# Patient Record
Sex: Female | Born: 1975 | Race: Black or African American | Hispanic: No | Marital: Single | Smoking: Former smoker
Health system: Southern US, Community
[De-identification: ages and names within clinical notes are randomized; demographics above are authoritative.]

## PROBLEM LIST (undated history)

## (undated) DIAGNOSIS — R5383 Other fatigue: Secondary | ICD-10-CM

## (undated) DIAGNOSIS — F29 Unspecified psychosis not due to a substance or known physiological condition: Secondary | ICD-10-CM

## (undated) DIAGNOSIS — J189 Pneumonia, unspecified organism: Secondary | ICD-10-CM

## (undated) DIAGNOSIS — T4145XA Adverse effect of unspecified anesthetic, initial encounter: Secondary | ICD-10-CM

## (undated) DIAGNOSIS — I10 Essential (primary) hypertension: Secondary | ICD-10-CM

## (undated) DIAGNOSIS — F4310 Post-traumatic stress disorder, unspecified: Secondary | ICD-10-CM

## (undated) DIAGNOSIS — T8859XA Other complications of anesthesia, initial encounter: Secondary | ICD-10-CM

## (undated) DIAGNOSIS — F329 Major depressive disorder, single episode, unspecified: Secondary | ICD-10-CM

## (undated) DIAGNOSIS — R51 Headache: Secondary | ICD-10-CM

## (undated) DIAGNOSIS — F32A Depression, unspecified: Secondary | ICD-10-CM

## (undated) DIAGNOSIS — E119 Type 2 diabetes mellitus without complications: Secondary | ICD-10-CM

## (undated) DIAGNOSIS — R519 Headache, unspecified: Secondary | ICD-10-CM

## (undated) DIAGNOSIS — K219 Gastro-esophageal reflux disease without esophagitis: Secondary | ICD-10-CM

## (undated) DIAGNOSIS — Z8489 Family history of other specified conditions: Secondary | ICD-10-CM

## (undated) HISTORY — DX: Major depressive disorder, single episode, unspecified: F32.9

## (undated) HISTORY — PX: NOVASURE ABLATION: SHX5394

## (undated) HISTORY — DX: Unspecified psychosis not due to a substance or known physiological condition: F29

## (undated) HISTORY — PX: BREAST REDUCTION SURGERY: SHX8

## (undated) HISTORY — DX: Headache: R51

## (undated) HISTORY — DX: Type 2 diabetes mellitus without complications: E11.9

## (undated) HISTORY — DX: Headache, unspecified: R51.9

## (undated) HISTORY — DX: Post-traumatic stress disorder, unspecified: F43.10

## (undated) HISTORY — DX: Depression, unspecified: F32.A

## (undated) HISTORY — DX: Essential (primary) hypertension: I10

## (undated) HISTORY — DX: Other fatigue: R53.83

---

## 2001-11-16 ENCOUNTER — Ambulatory Visit (HOSPITAL_BASED_OUTPATIENT_CLINIC_OR_DEPARTMENT_OTHER): Admission: RE | Admit: 2001-11-16 | Discharge: 2001-11-17 | Payer: Self-pay | Admitting: Plastic Surgery

## 2003-11-09 HISTORY — PX: REDUCTION MAMMAPLASTY: SUR839

## 2005-01-28 ENCOUNTER — Emergency Department: Payer: Self-pay | Admitting: Emergency Medicine

## 2005-08-16 ENCOUNTER — Other Ambulatory Visit: Payer: Self-pay

## 2005-08-20 ENCOUNTER — Ambulatory Visit: Payer: Self-pay | Admitting: Obstetrics and Gynecology

## 2006-08-15 ENCOUNTER — Emergency Department: Payer: Self-pay | Admitting: Emergency Medicine

## 2006-12-15 ENCOUNTER — Emergency Department: Payer: Self-pay | Admitting: Emergency Medicine

## 2007-07-19 ENCOUNTER — Emergency Department: Payer: Self-pay | Admitting: Emergency Medicine

## 2010-10-27 ENCOUNTER — Emergency Department: Payer: Self-pay | Admitting: Emergency Medicine

## 2011-01-20 ENCOUNTER — Emergency Department: Payer: Self-pay | Admitting: Emergency Medicine

## 2011-03-17 ENCOUNTER — Ambulatory Visit: Payer: Self-pay | Admitting: Specialist

## 2012-01-09 ENCOUNTER — Emergency Department: Payer: Self-pay | Admitting: Unknown Physician Specialty

## 2012-01-09 LAB — CBC
HCT: 39.6 % (ref 35.0–47.0)
HGB: 13.2 g/dL (ref 12.0–16.0)
MCH: 28.1 pg (ref 26.0–34.0)
Platelet: 275 10*3/uL (ref 150–440)
RBC: 4.69 10*6/uL (ref 3.80–5.20)
RDW: 14.7 % — ABNORMAL HIGH (ref 11.5–14.5)
WBC: 5.1 10*3/uL (ref 3.6–11.0)

## 2012-01-09 LAB — CK TOTAL AND CKMB (NOT AT ARMC)
CK, Total: 160 U/L (ref 21–215)
CK-MB: 0.6 ng/mL (ref 0.5–3.6)

## 2012-01-09 LAB — COMPREHENSIVE METABOLIC PANEL
Albumin: 3.4 g/dL (ref 3.4–5.0)
Alkaline Phosphatase: 73 U/L (ref 50–136)
BUN: 12 mg/dL (ref 7–18)
Bilirubin,Total: 0.4 mg/dL (ref 0.2–1.0)
Chloride: 101 mmol/L (ref 98–107)
Creatinine: 0.94 mg/dL (ref 0.60–1.30)
EGFR (African American): >60
Glucose: 270 mg/dL — ABNORMAL HIGH (ref 65–99)
Osmolality: 287 (ref 275–301)
SGPT (ALT): 38 U/L
Total Protein: 7.5 g/dL (ref 6.4–8.2)

## 2012-01-09 LAB — TROPONIN I
Troponin-I: <0.02 ng/mL
Troponin-I: <0.02 ng/mL

## 2013-08-08 ENCOUNTER — Emergency Department: Payer: Self-pay | Admitting: Emergency Medicine

## 2013-08-08 LAB — COMPREHENSIVE METABOLIC PANEL
Albumin: 3.6 g/dL (ref 3.4–5.0)
Alkaline Phosphatase: 87 U/L (ref 50–136)
BUN: 11 mg/dL (ref 7–18)
Calcium, Total: 8.9 mg/dL (ref 8.5–10.1)
Creatinine: 0.96 mg/dL (ref 0.60–1.30)
EGFR (African American): >60
EGFR (Non-African Amer.): >60
Glucose: 225 mg/dL — ABNORMAL HIGH (ref 65–99)
Osmolality: 282 (ref 275–301)
Potassium: 3.7 mmol/L (ref 3.5–5.1)
SGOT(AST): 27 U/L (ref 15–37)
SGPT (ALT): 47 U/L (ref 12–78)
Sodium: 138 mmol/L (ref 136–145)
Total Protein: 7.8 g/dL (ref 6.4–8.2)

## 2013-08-08 LAB — URINALYSIS, COMPLETE
Bilirubin,UR: NEGATIVE
Blood: NEGATIVE
Glucose,UR: >=500 mg/dL (ref 0–75)
Leukocyte Esterase: NEGATIVE
Nitrite: NEGATIVE
Protein: NEGATIVE
RBC,UR: 1 /HPF (ref 0–5)

## 2013-08-08 LAB — CBC
MCH: 28.5 pg (ref 26.0–34.0)
MCHC: 34.2 g/dL (ref 32.0–36.0)
MCV: 83 fL (ref 80–100)
Platelet: 321 10*3/uL (ref 150–440)
RBC: 5.08 10*6/uL (ref 3.80–5.20)
RDW: 13.9 % (ref 11.5–14.5)
WBC: 6.4 10*3/uL (ref 3.6–11.0)

## 2014-04-21 ENCOUNTER — Ambulatory Visit: Payer: Self-pay | Admitting: Obstetrics and Gynecology

## 2014-04-21 LAB — URINALYSIS, COMPLETE
BACTERIA: NONE SEEN
BILIRUBIN, UR: NEGATIVE
BLOOD: NEGATIVE
Glucose,UR: NEGATIVE mg/dL (ref 0–75)
Leukocyte Esterase: NEGATIVE
NITRITE: NEGATIVE
PH: 5 (ref 4.5–8.0)
Protein: 30
Specific Gravity: 1.028 (ref 1.003–1.030)

## 2014-04-21 LAB — CBC WITH DIFFERENTIAL/PLATELET
BASOS PCT: 0.2 %
Basophil #: 0 10*3/uL (ref 0.0–0.1)
Eosinophil #: 0 10*3/uL (ref 0.0–0.7)
Eosinophil %: 0.2 %
HCT: 41.5 % (ref 35.0–47.0)
HGB: 13.7 g/dL (ref 12.0–16.0)
Lymphocyte #: 1.1 10*3/uL (ref 1.0–3.6)
Lymphocyte %: 7.4 %
MCH: 27.5 pg (ref 26.0–34.0)
MCHC: 33.1 g/dL (ref 32.0–36.0)
MCV: 83 fL (ref 80–100)
MONO ABS: 0.8 x10 3/mm (ref 0.2–0.9)
MONOS PCT: 5.7 %
NEUTROS PCT: 86.5 %
Neutrophil #: 12.8 10*3/uL — ABNORMAL HIGH (ref 1.4–6.5)
Platelet: 383 10*3/uL (ref 150–440)
RBC: 4.99 10*6/uL (ref 3.80–5.20)
RDW: 14.8 % — ABNORMAL HIGH (ref 11.5–14.5)
WBC: 14.8 10*3/uL — AB (ref 3.6–11.0)

## 2014-04-21 LAB — COMPREHENSIVE METABOLIC PANEL
ANION GAP: 7 (ref 7–16)
Albumin: 3.4 g/dL (ref 3.4–5.0)
Alkaline Phosphatase: 83 U/L
BILIRUBIN TOTAL: 0.7 mg/dL (ref 0.2–1.0)
BUN: 8 mg/dL (ref 7–18)
CALCIUM: 8.9 mg/dL (ref 8.5–10.1)
CHLORIDE: 105 mmol/L (ref 98–107)
Co2: 24 mmol/L (ref 21–32)
Creatinine: 0.86 mg/dL (ref 0.60–1.30)
EGFR (African American): >60
GLUCOSE: 162 mg/dL — AB (ref 65–99)
OSMOLALITY: 274 (ref 275–301)
POTASSIUM: 3.8 mmol/L (ref 3.5–5.1)
SGOT(AST): 20 U/L (ref 15–37)
SGPT (ALT): 22 U/L (ref 12–78)
SODIUM: 136 mmol/L (ref 136–145)
Total Protein: 8.2 g/dL (ref 6.4–8.2)

## 2014-04-21 LAB — GC/CHLAMYDIA PROBE AMP

## 2014-04-21 LAB — WET PREP, GENITAL

## 2014-04-23 LAB — PATHOLOGY REPORT

## 2014-04-25 LAB — WOUND CULTURE

## 2015-01-23 ENCOUNTER — Observation Stay: Payer: Self-pay | Admitting: Internal Medicine

## 2015-02-09 ENCOUNTER — Emergency Department: Admit: 2015-02-09 | Disposition: A | Discharge status: Home / Self Care | Payer: Self-pay | Admitting: Family Medicine

## 2015-02-09 LAB — COMPREHENSIVE METABOLIC PANEL
ANION GAP: 6 — AB (ref 7–16)
Albumin: 3.4 g/dL — ABNORMAL LOW
Alkaline Phosphatase: 70 U/L
BUN: 12 mg/dL
Bilirubin,Total: 0.9 mg/dL
CALCIUM: 8.7 mg/dL — AB
CO2: 26 mmol/L
CREATININE: 0.74 mg/dL
Chloride: 106 mmol/L
EGFR (Non-African Amer.): >60
GLUCOSE: 212 mg/dL — AB
Potassium: 4.4 mmol/L
SGOT(AST): 20 U/L
SGPT (ALT): 19 U/L
Sodium: 138 mmol/L
Total Protein: 7.2 g/dL

## 2015-02-09 LAB — CBC
HCT: 39.9 % (ref 35.0–47.0)
HGB: 12.8 g/dL (ref 12.0–16.0)
MCH: 26.9 pg (ref 26.0–34.0)
MCHC: 32 g/dL (ref 32.0–36.0)
MCV: 84 fL (ref 80–100)
Platelet: 315 10*3/uL (ref 150–440)
RBC: 4.76 10*6/uL (ref 3.80–5.20)
RDW: 13.7 % (ref 11.5–14.5)
WBC: 5.5 10*3/uL (ref 3.6–11.0)

## 2015-02-09 LAB — TROPONIN I: Troponin-I: <0.03 ng/mL

## 2015-02-22 LAB — CULTURE, BLOOD (SINGLE)

## 2015-03-01 NOTE — Op Note (Signed)
PATIENT NAME:  Rebekah Jacobs, Rebekah Jacobs MR#:  [id number] DATE OF BIRTH:  02-08-1976  DATE OF PROCEDURE:  04/21/2014  PREOPERATIVE DIAGNOSES:  1. Pelvic pain.  2. Abnormal collection of fluid in the uterine cavity.   POSTOPERATIVE DIAGNOSES: 1. Pelvic pain.  2. Hematometra/pyometrium.   PROCEDURES:  1. Hysteroscopy.  2. Suction, dilation, and curettage.   ANESTHESIA: General.   SURGEON: Thomasene MohairStephen Jackson, MD   ESTIMATED BLOOD LOSS: 10 mL.  OPERATIVE FLUIDS: 500 mL crystalloid.   COMPLICATIONS: None.   FINDINGS: Approximately 100 mL of blood and pus-like material in the uterus which was mostly blood.   SPECIMENS:  1. Endometrial curettings.  2. Sample of uterine fluid for culture.   CONDITION AT THE END OF PROCEDURE: Stable.   PROCEDURE IN DETAIL: The patient was taken to the operating room where general anesthesia was administered and found to be adequate. She was placed in the dorsal supine high lithotomy position in candy cane stirrups and prepped and draped in the usual sterile fashion. After a timeout was called, an in and out catheterization was performed for return of about 25 mL of clear urine. A sterile speculum was placed in the vagina and a single-tooth tenaculum was used to grasp the anterior lip of the cervix. The cervix was dilated gently in a serial fashion using Hegar dilators to a dilatation of approximately 8 mm. Noted upon dilatation the efflux of the dark, red blood-like material with just a small amount of white streaks. Once dilatation to 8 mm was obtained, a curette was passed and more material returned. Given concern for clot in the uterus, an 8-mm rigid suction curette was utilized to clean out and remove all clot-like material and break up any septations that might be present. After this was accomplished, another gentle curettage was performed. The hysteroscope was then gently introduced through the cervix into the uterus with the above-noted findings. The uterine cavity  was somewhat still deformed given her history of endometrial ablation. No other abnormal findings were appreciated at this point. Given that all material inside the uterus was removed and verification that no loculated fliud collections remained and hemostasis was noted, the hysteroscope was removed and the procedure was terminated. The single-tooth tenaculum was removed from the anterior lip of the cervix and hemostasis was noted at the tenaculum entry sites. The vagina was inspected and no instrumentation or sponges were left in the vagina. The speculum was removed.   The patient tolerated the procedure well. Sponge, lap, and needle counts were correct x 2. For antibiotic prophylaxis, the patient was given 100 mg of doxycycline just prior to the start of the procedure. The patient was awakened in the operating room and taken to the recovery area in stable condition.    ____________________________ Rebekah NovakStephen D. Jackson, MD sdj:lt D: 04/21/2014 21:34:02 ET T: 04/21/2014 21:50:47 ET JOB#: [id number]  cc: Rebekah NovakStephen D. Jackson, MD, <Dictator> Rebekah NovakSTEPHEN D JACKSON MD ELECTRONICALLY SIGNED 05/09/2014 9:49

## 2015-03-09 NOTE — H&P (Signed)
PATIENT NAME:  Rebekah Jacobs, Rebekah Jacobs MR#:  [id number] DATE OF BIRTH:  09-23-1976  DATE OF ADMISSION:  01/23/2015  PRIMARY CARE PROVIDER: Dr. Lin Givens  EMERGENCY DEPARTMENT REFERRING PHYSICIAN: Dr. Mayford Knife  CHIEF COMPLAINT: Shortness of breath, cough, fever of 102, elevated troponin.  HISTORY OF PRESENT ILLNESS: The patient is a 39 year old female with history of hypertension, diabetes, hyperlipidemia, and morbid obesity who states that she had a nephew visiting her at her house a few days ago and he was sick. Subsequently, she started having cough and shortness of breath. She has had productive mucus of yellow color with blood tinge and her shortness of breath is mainly with activity. The patient has also had fevers. Has not had any wheezing. The patient has no previous lung disease. No history of smoking. She came to the ED with these symptoms and was noted to have a troponin that was mildly abnormal at 0.04. She reports only chest pain with taking deep breaths but no chest pressure. Her EKG is negative. ED physician is concerned about this abnormal troponin and he wanted her to be observed to follow the trend.  PAST MEDICAL HISTORY: 1.  Diabetes type 2.  2.  Bulging disk.  3.  Hypertension.  4.  Breast reduction. 5.  Hysterectomy.   ALLERGIES: PEANUTS, SHELLFISH.   CURRENT MEDICATIONS: Metformin 1000 one tab p.o. b.i.d., Lipitor 10 mg once a day, hydrochlorothiazide 25 p.o. daily.   SOCIAL HISTORY: Does not smoke. Does not drink. No drugs.   FAMILY HISTORY: Sister died of heart disease at age 44 months. No other significant family history of heart disease.   REVIEW OF SYSTEMS: CONSTITUTIONAL: Complains of fevers. No fatigue. No weakness. No weight loss. No weight gain.  EYES: No blurred or double vision. No redness. No inflammation.  ENT: No tinnitus. No ear pain. No hearing loss. No seasonal or year-round allergies. No epistaxis.  RESPIRATORY: Complains of cough. No wheezing. No COPD. No TB.   CARDIOVASCULAR: Complains of chest pain with coughing, but no chest pain on exertion. No orthopnea. No edema. No arrhythmia. No palpitation.  GASTROINTESTINAL: No nausea, vomiting, diarrhea. No abdominal pain. No hematemesis.  GENITOURINARY: Denies any frequency, urgency or hesitancy.  ENDOCRINE: Denies any polyuria, nocturia, OR thyroid enlargement. HEME AND LYMPH:  Denies anemia, easy bruisability or bleeding.  SKIN: No acne. No rash.  MUSCULOSKELETAL: No pain in the neck, back or shoulder.  NEUROLOGIC: No CVA, TIA or seizures.  PSYCHIATRIC: No anxiety, insomnia.   PHYSICAL EXAMINATION: VITAL SIGNS: Temperature 98.1, pulse 93, respirations 20, blood pressure 131/92, O2 99%.  GENERAL: The patient is a morbidly obese female in no acute distress.  HEENT: Head atraumatic, normocephalic. Pupils equally round and reactive to light and accommodation. There is no conjunctival pallor. No sclerae icterus. Nasal exam shows no drainage or ulceration. Oropharynx is clear without any exudate.  NECK: Supple without any thyromegaly.  CARDIOVASCULAR: Regular rate and rhythm. No murmurs, rubs, clicks, or gallops.  LUNGS: Clear to auscultation bilaterally without any rales, rhonchi, wheezing.  ABDOMEN: Soft, nontender, nondistended. Positive bowel sounds x4.  EXTREMITIES: No clubbing, cyanosis, or edema.  SKIN: No rash.  LYMPH NODES: Nonpalpable.  MUSCULOSKELETAL: There is no erythema or swelling.  VASCULAR: Good DP and PT pulses.  PSYCHIATRIC: Not anxious or depressed. Marland Kitchen  DIAGNOSTIC DATA: CT of chest: Patchy pulmonary opacities at left base, which are indeterminate, most likely reflect inflammatory process.   WBC 7.8, hemoglobin 12.6, platelet count 305,000. Glucose 164, BUN 8, creatinine 0.64, sodium  139, potassium 3.7, chloride 105, calcium 8.6. Troponin 0.04.   EKG: Normal sinus rhythm without any ST-T wave changes.   ASSESSMENT AND PLAN: The patient is a 39 year old with history of hypertension  and diabetes who presents with cough, shortness of breath, chest pain with coughing, noted to have troponin of 0.04.  1.  Shortness of breath, cough, and fever. Likely due to possible atypical pneumonia or bronchitis. At this time, we will treat her with IV Levaquin, place her antitussives.  2.  Elevated troponin, possibly minimally elevated due to acute respiratory symptoms. Monitor. If starts trending up, then will need further cardiac evaluation. 3.  Diabetes. We will hold metformin due to IV contrast. We will place her on sliding scale insulin.  4.  Hypertension. Continue HCTZ.  5.  Hyperlipidemia. Continue Lipitor as taking at home.  TIME SPENT: 50 minutes.  ____________________________ Lacie ScottsShreyang H. Allena KatzPatel, MD shp:sb D: 01/23/2015 14:29:26 ET T: 01/23/2015 14:39:59 ET JOB#: [id number]  cc: Prabhjot Piscitello H. Allena KatzPatel, MD, <Dictator> Charise CarwinSHREYANG H Joseth Weigel MD ELECTRONICALLY SIGNED 02/03/2015 14:16

## 2015-03-09 NOTE — Discharge Summary (Signed)
PATIENT NAME:  Rebekah Jacobs, Rebekah Jacobs MR#:  [id number] DATE OF BIRTH:  1976-02-24  DATE OF ADMISSION:  01/23/2015 DATE OF DISCHARGE:  01/24/2015  PRIMARY CARE PHYSICIAN: Dr. Lin GivensSavage  DISCHARGE DIAGNOSES:  1.  Left-sided pneumonia.  2.  Hypertension.  3.  Diabetes.  4.  Hyperlipidemia.  5.  Obesity.   CONDITION: Stable.   CODE STATUS: FULL.  HOME MEDICATIONS: Please refer to the medication reconciliation list.  DISCHARGE DIET: Low-sodium, low-fat, low-cholesterol, ADA diet.   DISCHARGE ACTIVITY: As tolerated.   FOLLOW-UP CARE: Follow up with PCP within 1 to 2 weeks.   REASON FOR ADMISSION: Shortness of breath, cough, and fever of 102.   HOSPITAL COURSE:  1.  The patient is a 39 year old African American female with a history of hypertension, diabetes, and hyperlipidemia who presented the ED with shortness of breath with productive cough. The patient also complains of chest pain while coughing. EKG was negative. For detailed history and physical examination, please refer to the admission note dictated by Dr. Auburn BilberryShreyang Patel. The patient's chest x-ray was negative, but the patient got a CT angio, which did not show PE, but showed left-sided pneumonia. After admission the has been treated with Levaquin and anti-cough medication p.r.n. The patient has mild elevated troponin of 0.04 and nonspecific. The patient has no typical chest pain.  2.  Diabetes. Has been treated with sliding scale.  3.  Hypertension. Has been controlled.   The patient has no fever or shortness of breath. She only has a cough. Physical examination is unremarkable. Vital signs are stable. She is clinically stable and will be discharged to home today.   I discussed the patient's discharge plan with the patient, nurse, and case manager.   TIME SPENT: About 35 minutes.   ____________________________ Shaune PollackQing Keeana Pieratt, MD qc:sb D: 01/24/2015 14:09:04 ET T: 01/24/2015 15:20:44 ET JOB#: [id number]  cc: Shaune PollackQing Laureano Hetzer, MD, <Dictator> Shaune PollackQING  Breelle Hollywood MD ELECTRONICALLY SIGNED 01/24/2015 16:12

## 2015-03-18 NOTE — H&P (Signed)
L&D Evaluation:  History Expanded:  HPI 39 yo G1P1001 who presents to the ER today after having two weeks of worsening abdominopelvic pain.  The pain is "all over" her abdomen.  Her medical history is notable for an endometrial ablation in 2005 by Dr. Ouida Sills for abnormal bleeding.  She has had no periods since then until about two week ago.  She had a very heavy peroid and has continued to spot since then.  She has had mild discomfort for the past year.  In the past two weeks she has had fevers, sweating and chills, nausea with emesis, and constipation.  She has urinary symptoms of difficulty voiding.  She denies vaginal symptoms.  She is not sexually active and denies any sexual activity for the past 15 years.  She had menarche at age 39. She denies a history of STDs and abnormal pap smears.  She has not had a pap smear in over 5 years.   Patient's Medical History 1) Type 2 diabetes mellitus, 2) hypertension   Patient's Surgical History 1) mammoplasty, 2) endometrial ablation radiofrequency ablation   Medications 1) meformin 1,022m po bid, 2) lipitor 165mpo daily, 3) naproxyn 500 mg po bid prn pain, 4) tylenol pm prn insomnia   Allergies NKDA   Social History none   Family History mother with breast cancer in her 2046sfather with pancreatic cancer in his 6070s ROS:  ROS All systems were reviewed.  HEENT, CNS, GI, GU, Respiratory, CV, Renal and Musculoskeletal systems were found to be normal., unless noted in HPI   Exam:  Vital Signs T98.1, P119, BP 138/94, RR18, O2 sats 98%RA   General no apparent distress   Mental Status clear   Chest clear   Heart normal sinus rhythm   Abdomen soft, mildly tender to palpation all over, +BS, no rebound or guarding, obese   Edema no edema   Other Pelvic U/S: 4x5x6cm collection of fluid within the uterine cavity. small amount of complex fluid collection in the posterior cul-de-sac   Impression:  Impression 1) abnormal collection of  fluid within uterine cavity s/p endometrial ablation, 2) difficult to control pain   Plan:  Comments Admit to take to OR to remove collection of fluid in uterus.  Unsure whether represents pus, blood, or something that could be associated with malignancy.  Explained procedure to patient, who strongly desires pain relief and to proceed with procedure.   Labs:  Lab Results: Hepatic:  14-Jun-15 10:02   Bilirubin, Total 0.7  Alkaline Phosphatase 83 (45-117 NOTE: New Reference Range 09/28/13)  SGPT (ALT) 22  SGOT (AST) 20  Total Protein, Serum 8.2  Albumin, Serum 3.4  Routine Micro:  14-Jun-15 14:48   Micro Text Report WET PREP   COMMENT                   MODERATE WHITE BLOOD CELLS SEEN   COMMENT                   NO TRICHOMONAS,SPERMATOZOA,YEAST,OR CLUE CELLS SEEN   ANTIBIOTIC                       Micro Text Report CHLAM/N.GC RT-PCR ([facility])   CHLAMYDIA                 CHLAMYDIA TRACHOMATIS NEGATIVE   N.GONORRHOEAE             N.GONORRHOEAE NEGATIVE   ANTIBIOTIC  Comment 1. MODERATE WHITE BLOOD CELLS SEEN  Comment 2. NO TRICHOMONAS,SPERMATOZOA,YEAST,OR CLUE CELLS SEEN  Result(s) reported on 21 Apr 2014 at 03:27PM.  Routine Chem:  14-Jun-15 10:02   Glucose, Serum  162  BUN 8  Creatinine (comp) 0.86  Sodium, Serum 136  Potassium, Serum 3.8  Chloride, Serum 105  CO2, Serum 24  Calcium (Total), Serum 8.9  Osmolality (calc) 274  eGFR (African American) >60  eGFR (Non-African American) >60 (eGFR values <10m/min/1.73 m2 may be an indication of chronic kidney disease (CKD). Calculated eGFR is useful in patients with stable renal function. The eGFR calculation will not be reliable in acutely ill patients when serum creatinine is changing rapidly. It is not useful in  patients on dialysis. The eGFR calculation may not be applicable to patients at the low and high extremes of body sizes, pregnant women, and vegetarians.)  Anion Gap 7  Routine UA:   14-Jun-15 10:02   Color (UA) Yellow  Clarity (UA) Hazy  Glucose (UA) Negative  Bilirubin (UA) Negative  Ketones (UA) Trace  Specific Gravity (UA) 1.028  Blood (UA) Negative  pH (UA) 5.0  Protein (UA) 30 mg/dL  Nitrite (UA) Negative  Leukocyte Esterase (UA) Negative (Result(s) reported on 21 Apr 2014 at 10:25AM.)  RBC (UA) 3 /HPF  WBC (UA) 1 /HPF  Bacteria (UA) NONE SEEN  Epithelial Cells (UA) 4 /HPF  Mucous (UA) PRESENT (Result(s) reported on 21 Apr 2014 at 10:25AM.)  Routine Hem:  14-Jun-15 10:02   WBC (CBC)  14.8  RBC (CBC) 4.99  Hemoglobin (CBC) 13.7  Hematocrit (CBC) 41.5  Platelet Count (CBC) 383  MCV 83  MCH 27.5  MCHC 33.1  RDW  14.8  Neutrophil % 86.5  Lymphocyte % 7.4  Monocyte % 5.7  Eosinophil % 0.2  Basophil % 0.2  Neutrophil #  12.8  Lymphocyte # 1.1  Monocyte # 0.8  Eosinophil # 0.0  Basophil # 0.0 (Result(s) reported on 21 Apr 2014 at 10:42AM.)   Electronic Signatures: JWill Bonnet(MD)  (Signed 14-Jun-15 19:33)  Authored: L&D Evaluation, Labs   Last Updated: 14-Jun-15 19:33 by JWill Bonnet(MD)

## 2015-07-16 ENCOUNTER — Ambulatory Visit (HOSPITAL_COMMUNITY): Payer: Self-pay | Admitting: Psychiatry

## 2015-07-16 ENCOUNTER — Encounter (INDEPENDENT_AMBULATORY_CARE_PROVIDER_SITE_OTHER): Payer: Self-pay

## 2015-07-16 ENCOUNTER — Encounter (HOSPITAL_COMMUNITY): Payer: Self-pay | Admitting: Psychiatry

## 2015-07-16 ENCOUNTER — Ambulatory Visit (INDEPENDENT_AMBULATORY_CARE_PROVIDER_SITE_OTHER): Payer: Medicare HMO | Admitting: Psychiatry

## 2015-07-16 VITALS — BP 134/98 | HR 96 | Ht 67.0 in | Wt 335.0 lb

## 2015-07-16 DIAGNOSIS — F333 Major depressive disorder, recurrent, severe with psychotic symptoms: Secondary | ICD-10-CM

## 2015-07-16 DIAGNOSIS — R440 Auditory hallucinations: Secondary | ICD-10-CM | POA: Diagnosis not present

## 2015-07-16 DIAGNOSIS — F4310 Post-traumatic stress disorder, unspecified: Secondary | ICD-10-CM | POA: Diagnosis not present

## 2015-07-16 DIAGNOSIS — F323 Major depressive disorder, single episode, severe with psychotic features: Secondary | ICD-10-CM

## 2015-07-16 DIAGNOSIS — F411 Generalized anxiety disorder: Secondary | ICD-10-CM | POA: Diagnosis not present

## 2015-07-16 DIAGNOSIS — I10 Essential (primary) hypertension: Secondary | ICD-10-CM

## 2015-07-16 HISTORY — DX: Essential (primary) hypertension: I10

## 2015-07-16 MED ORDER — ESCITALOPRAM OXALATE 20 MG PO TABS: 20.0000 mg | ORAL_TABLET | Freq: Every day | ORAL | Status: DC

## 2015-07-16 MED ORDER — RISPERIDONE 1 MG PO TABS: 1.0000 mg | ORAL_TABLET | Freq: Every day | ORAL | Status: DC
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
## 2015-07-17 ENCOUNTER — Telehealth (HOSPITAL_COMMUNITY): Payer: Self-pay

## 2015-07-17 NOTE — Telephone Encounter (Signed)
Medication management - Telephone call back with [facility] with Victorino Dike, pharmacist to clarify Dr. Debbora Presto Risperdal order from 07/16/15 was for Risperdal , 1/2 tablet at bedtime.

## 2015-07-30 ENCOUNTER — Ambulatory Visit (HOSPITAL_COMMUNITY): Payer: Self-pay | Admitting: Psychiatry

## 2015-08-06 ENCOUNTER — Encounter: Payer: Self-pay | Admitting: Emergency Medicine

## 2015-08-06 ENCOUNTER — Emergency Department
Admission: EM | Admit: 2015-08-06 | Discharge: 2015-08-06 | Disposition: A | Discharge status: Home / Self Care | Payer: Medicare HMO | Attending: Emergency Medicine | Admitting: Emergency Medicine

## 2015-08-06 DIAGNOSIS — F4310 Post-traumatic stress disorder, unspecified: Secondary | ICD-10-CM | POA: Insufficient documentation

## 2015-08-06 DIAGNOSIS — J069 Acute upper respiratory infection, unspecified: Secondary | ICD-10-CM | POA: Diagnosis not present

## 2015-08-06 DIAGNOSIS — F329 Major depressive disorder, single episode, unspecified: Secondary | ICD-10-CM | POA: Insufficient documentation

## 2015-08-06 DIAGNOSIS — E119 Type 2 diabetes mellitus without complications: Secondary | ICD-10-CM | POA: Insufficient documentation

## 2015-08-06 DIAGNOSIS — Z79899 Other long term (current) drug therapy: Secondary | ICD-10-CM | POA: Insufficient documentation

## 2015-08-06 DIAGNOSIS — Z791 Long term (current) use of non-steroidal anti-inflammatories (NSAID): Secondary | ICD-10-CM | POA: Diagnosis not present

## 2015-08-06 DIAGNOSIS — I10 Essential (primary) hypertension: Secondary | ICD-10-CM | POA: Insufficient documentation

## 2015-08-06 DIAGNOSIS — R05 Cough: Secondary | ICD-10-CM | POA: Diagnosis present

## 2015-08-06 MED ORDER — PSEUDOEPHEDRINE HCL ER 120 MG PO TB12: 120.0000 mg | ORAL_TABLET | Freq: Two times a day (BID) | ORAL | Status: DC | PRN

## 2015-08-06 MED ORDER — HYDROCOD POLST-CPM POLST ER 10-8 MG/5ML PO SUER: 5.0000 mL | Freq: Two times a day (BID) | ORAL | Status: DC

## 2015-08-06 MED ORDER — HYDROCOD POLST-CPM POLST ER 10-8 MG/5ML PO SUER
5.0000 mL | Freq: Once | ORAL | Status: AC
  Administered 2015-08-06: 5 mL via ORAL
  Filled 2015-08-06: qty 5

## 2015-08-06 MED ORDER — IBUPROFEN 800 MG PO TABS: 800.0000 mg | ORAL_TABLET | Freq: Three times a day (TID) | ORAL | Status: DC | PRN

## 2015-08-06 NOTE — ED Provider Notes (Signed)
[facility] Emergency Department Provider Note  ____________________________________________  Time seen: Approximately 10:18 AM  I have reviewed the triage vital signs and the nursing notes.   HISTORY  Chief Complaint Cough    HPI Rebekah Jacobs is a 39 y.o. female patient complaining of fourth cough for 2 days. Patient states cough is productive. Patient stated relief with the use of her inhaler. Patient also complaining low grade fever on Monday which is resolved. Patient is coughing spells of making her body ache. Patient stated this frontal sinus pressure and postnasal drainage. Patient denies any nausea vomiting or diarrhea. No other palliative measures taken for this complaint. Patient states overall myalgia without a pain scale rating.   Past Medical History  Diagnosis Date  . Diabetes mellitus, type II   . Depression   . Headache   . PTSD (post-traumatic stress disorder)   . Hypertension 07/16/2015  . Psychosis   . Fatigue     Patient Active Problem List   Diagnosis Date Noted  . 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 PTSD (post-traumatic stress disorder) 07/16/2015  . Depression, major, recurrent, severe with psychosis 07/16/2015  . GAD (generalized anxiety disorder) 07/16/2015    History reviewed. No pertinent past surgical history.  Current Outpatient Rx  Name  Route  Sig  Dispense  Refill  . chlorpheniramine-HYDROcodone (TUSSIONEX PENNKINETIC ER) 10-8 MG/5ML SUER   Oral   Take 5 mLs by mouth 2 (two) times daily.   115 mL   0   . escitalopram (LEXAPRO) 20 MG tablet   Oral   Take 1 tablet (20 mg total) by mouth daily.   30 tablet   2   . gabapentin (NEURONTIN) 300 MG capsule   Oral   Take 300 mg by mouth 3 (three) times daily.         Marland Kitchen ibuprofen (ADVIL,MOTRIN) 800 MG tablet   Oral   Take 1 tablet (800 mg total) by mouth every 8 (eight) hours as needed for moderate pain.   15 tablet   0   . lisinopril  (PRINIVIL,ZESTRIL) 5 MG tablet   Oral   Take 10 mg by mouth at bedtime.         . lovastatin (MEVACOR) 20 MG tablet   Oral   Take 20 mg by mouth at bedtime.         . metFORMIN (GLUCOPHAGE-XR) 500 MG 24 hr tablet   Oral   Take 500 mg by mouth. At bedtime         . naproxen (NAPROSYN) 500 MG tablet   Oral   Take 500 mg by mouth at bedtime.         . pseudoephedrine (SUDAFED) 120 MG 12 hr tablet   Oral   Take 1 tablet (120 mg total) by mouth 2 (two) times daily as needed for congestion.   20 tablet   0   . risperiDONE (RISPERDAL) 1 MG tablet   Oral   Take 1 tablet (1 mg total) by mouth at bedtime. Take half a tablet at bedtime   30 tablet   2     Allergies Shellfish allergy and Peanuts  Family History  Problem Relation Age of Onset  . Depression Mother   . Depression Father   . Alcohol abuse Father   . Anxiety disorder Father   . Depression Daughter     Social History Social History  Substance Use Topics  . Smoking status: Never Smoker   . Smokeless  tobacco: None  . Alcohol Use: No    Review of Systems Constitutional: No fever/chills Eyes: No visual changes. ENT: No sore throat. Sinus pressure and postnasal drainage. Cardiovascular: Denies chest pain. Respiratory: Denies shortness of breath. Productive cough. Gastrointestinal: No abdominal pain.  No nausea, no vomiting.  No diarrhea.  No constipation. Genitourinary: Negative for dysuria. Musculoskeletal: Negative for back pain. Skin: Negative for rash. Neurological: Negative for headaches, focal weakness or numbness. Psychiatric:Depression and post traumatic stress disorder. Endocrine:Diabetes Hematological/Lymphatic: Allergic/Immunilogical: Peanuts and shellfish.  10-point ROS otherwise negative.  ____________________________________________   PHYSICAL EXAM:  VITAL SIGNS: ED Triage Vitals  Enc Vitals Group     BP 08/06/15 1004 132/103 mmHg     Pulse Rate 08/06/15 1004 97     Resp  08/06/15 1004 18     Temp 08/06/15 1004 98.1 F (36.7 C)     Temp Source 08/06/15 1004 Oral     SpO2 08/06/15 1004 94 %     Weight 08/06/15 1004 326 lb (147.873 kg)     Height 08/06/15 1004  (1.702 m)     Head Cir --      Peak Flow --      Pain Score --      Pain Loc --      Pain Edu? --      Excl. in GC? --     Constitutional: Alert and oriented. Well appearing and in no acute distress. Eyes: Conjunctivae are normal. PERRL. EOMI. Head: Atraumatic. Nose: No congestion/rhinnorhea. Mouth/Throat: Mucous membranes are moist.  Oropharynx non-erythematous. Postnasal drainage Neck: No stridor.   Hematological/Lymphatic/Immunilogical: No cervical lymphadenopathy. Cardiovascular: Normal rate, regular rhythm. Grossly normal heart sounds.  Good peripheral circulation. Elevation of diastolic blood pressure Respiratory: Normal respiratory effort.  No retractions. Lungs CTAB. The cough Gastrointestinal: Soft and nontender. No distention. No abdominal bruits. No CVA tenderness. Musculoskeletal: No lower extremity tenderness nor edema.  No joint effusions. Neurologic:  Normal speech and language. No gross focal neurologic deficits are appreciated. No gait instability. Skin:  Skin is warm, dry and intact. No rash noted. Psychiatric: Mood and affect are normal. Speech and behavior are normal.  ____________________________________________   LABS (all labs ordered are listed, but only abnormal results are displayed)  Labs Reviewed - No data to display ____________________________________________  EKG   ____________________________________________  RADIOLOGY   ____________________________________________   PROCEDURES  Procedure(s) performed: None  Critical Care performed: No  ____________________________________________   INITIAL IMPRESSION / ASSESSMENT AND PLAN / ED COURSE  Pertinent labs & imaging results that were available during my care of the patient were reviewed by  me and considered in my medical decision making (see chart for details).  Upper rest or infection. Patient given prescription for Sudafed and Tussionex. Patient advised to take Tylenol or ibuprofen for fever. Patient advised follow-up family doctor had this condition persists. ____________________________________________   FINAL CLINICAL IMPRESSION(S) / ED DIAGNOSES  Final diagnoses:  URI (upper respiratory infection)      Joni Reining, PA-C 08/06/15 1028  Myrna Blazer, MD 08/06/15 [phone]

## 2015-08-06 NOTE — ED Notes (Signed)
Presents to ED from home with cough that started 2 days ago. Pt states she typically coughs up phlegm. Has used her inhaler without relief. States had fever Monday of 100.0. Currently in no acute distress

## 2015-08-06 NOTE — Discharge Instructions (Signed)
Upper Respiratory Infection, Adult An upper respiratory infection (URI) is also sometimes known as the common cold. The upper respiratory tract includes the nose, sinuses, throat, trachea, and bronchi. Bronchi are the airways leading to the lungs. Most people improve within 1 week, but symptoms can last up to 2 weeks. A residual cough may last even longer.  CAUSES Many different viruses can infect the tissues lining the upper respiratory tract. The tissues become irritated and inflamed and often become very moist. Mucus production is also common. A cold is contagious. You can easily spread the virus to others by oral contact. This includes kissing, sharing a glass, coughing, or sneezing. Touching your mouth or nose and then touching a surface, which is then touched by another person, can also spread the virus. SYMPTOMS  Symptoms typically develop 1 to 3 days after you come in contact with a cold virus. Symptoms vary from person to person. They may include:  Runny nose.  Sneezing.  Nasal congestion.  Sinus irritation.  Sore throat.  Loss of voice (laryngitis).  Cough.  Fatigue.  Muscle aches.  Loss of appetite.  Headache.  Low-grade fever. DIAGNOSIS  You might diagnose your own cold based on familiar symptoms, since most people get a cold 2 to 3 times a year. Your caregiver can confirm this based on your exam. Most importantly, your caregiver can check that your symptoms are not due to another disease such as strep throat, sinusitis, pneumonia, asthma, or epiglottitis. Blood tests, throat tests, and X-rays are not necessary to diagnose a common cold, but they may sometimes be helpful in excluding other more serious diseases. Your caregiver will decide if any further tests are required. RISKS AND COMPLICATIONS  You may be at risk for a more severe case of the common cold if you smoke cigarettes, have chronic heart disease (such as heart failure) or lung disease (such as asthma), or if  you have a weakened immune system. The very young and very old are also at risk for more serious infections. Bacterial sinusitis, middle ear infections, and bacterial pneumonia can complicate the common cold. The common cold can worsen asthma and chronic obstructive pulmonary disease (COPD). Sometimes, these complications can require emergency medical care and may be life-threatening. PREVENTION  The best way to protect against getting a cold is to practice good hygiene. Avoid oral or hand contact with people with cold symptoms. Wash your hands often if contact occurs. There is no clear evidence that vitamin C, vitamin E, echinacea, or exercise reduces the chance of developing a cold. However, it is always recommended to get plenty of rest and practice good nutrition. TREATMENT  Treatment is directed at relieving symptoms. There is no cure. Antibiotics are not effective, because the infection is caused by a virus, not by bacteria. Treatment may include:  Increased fluid intake. Sports drinks offer valuable electrolytes, sugars, and fluids.  Breathing heated mist or steam (vaporizer or shower).  Eating chicken soup or other clear broths, and maintaining good nutrition.  Getting plenty of rest.  Using gargles or lozenges for comfort.  Controlling fevers with ibuprofen or acetaminophen as directed by your caregiver.  Increasing usage of your inhaler if you have asthma. Zinc gel and zinc lozenges, taken in the first 24 hours of the common cold, can shorten the duration and lessen the severity of symptoms. Pain medicines may help with fever, muscle aches, and throat pain. A variety of non-prescription medicines are available to treat congestion and runny nose. Your caregiver   can make recommendations and may suggest nasal or lung inhalers for other symptoms.  HOME CARE INSTRUCTIONS   Only take over-the-counter or prescription medicines for pain, discomfort, or fever as directed by your  caregiver.  Use a warm mist humidifier or inhale steam from a shower to increase air moisture. This may keep secretions moist and make it easier to breathe.  Drink enough water and fluids to keep your urine clear or pale yellow.  Rest as needed.  Return to work when your temperature has returned to normal or as your caregiver advises. You may need to stay home longer to avoid infecting others. You can also use a face mask and careful hand washing to prevent spread of the virus. SEEK MEDICAL CARE IF:   After the first few days, you feel you are getting worse rather than better.  You need your caregiver's advice about medicines to control symptoms.  You develop chills, worsening shortness of breath, or brown or red sputum. These may be signs of pneumonia.  You develop yellow or brown nasal discharge or pain in the face, especially when you bend forward. These may be signs of sinusitis.  You develop a fever, swollen neck glands, pain with swallowing, or white areas in the back of your throat. These may be signs of strep throat. SEEK IMMEDIATE MEDICAL CARE IF:   You have a fever.  You develop severe or persistent headache, ear pain, sinus pain, or chest pain.  You develop wheezing, a prolonged cough, cough up blood, or have a change in your usual mucus (if you have chronic lung disease).  You develop sore muscles or a stiff neck. Document Released: 04/20/2001 Document Revised: 01/17/2012 Document Reviewed: 01/30/2014 Exit[employer] Patient Information 2015 [employer]. This information is not intended to replace advice given to you by your health care provider. Make sure you discuss any questions you have with your health care provider.  

## 2015-08-07 ENCOUNTER — Ambulatory Visit (HOSPITAL_COMMUNITY): Payer: Self-pay | Admitting: Psychiatry

## 2015-12-23 ENCOUNTER — Encounter: Payer: Self-pay | Admitting: *Deleted

## 2015-12-23 DIAGNOSIS — Z87891 Personal history of nicotine dependence: Secondary | ICD-10-CM | POA: Diagnosis not present

## 2015-12-23 DIAGNOSIS — E119 Type 2 diabetes mellitus without complications: Secondary | ICD-10-CM | POA: Insufficient documentation

## 2015-12-23 DIAGNOSIS — Z7984 Long term (current) use of oral hypoglycemic drugs: Secondary | ICD-10-CM | POA: Insufficient documentation

## 2015-12-23 DIAGNOSIS — R000 Tachycardia, unspecified: Secondary | ICD-10-CM | POA: Diagnosis not present

## 2015-12-23 DIAGNOSIS — I10 Essential (primary) hypertension: Secondary | ICD-10-CM | POA: Diagnosis not present

## 2015-12-23 DIAGNOSIS — R05 Cough: Secondary | ICD-10-CM | POA: Diagnosis present

## 2015-12-23 DIAGNOSIS — Z791 Long term (current) use of non-steroidal anti-inflammatories (NSAID): Secondary | ICD-10-CM | POA: Diagnosis not present

## 2015-12-23 DIAGNOSIS — J40 Bronchitis, not specified as acute or chronic: Secondary | ICD-10-CM | POA: Insufficient documentation

## 2015-12-23 DIAGNOSIS — Z79899 Other long term (current) drug therapy: Secondary | ICD-10-CM | POA: Insufficient documentation

## 2015-12-23 NOTE — ED Notes (Addendum)
Pt reports coughing up green phlegm.  Sx for 2 days.  Hx cig smoking.  Intermittent fever.  Pt also has bodyaches, chills.  Pt alert.

## 2015-12-24 ENCOUNTER — Emergency Department: Payer: Medicare HMO

## 2015-12-24 ENCOUNTER — Emergency Department
Admission: EM | Admit: 2015-12-24 | Discharge: 2015-12-24 | Disposition: A | Discharge status: Home / Self Care | Payer: Medicare HMO | Attending: Emergency Medicine | Admitting: Emergency Medicine

## 2015-12-24 DIAGNOSIS — J40 Bronchitis, not specified as acute or chronic: Secondary | ICD-10-CM

## 2015-12-24 MED ORDER — BENZONATATE 100 MG PO CAPS
100.0000 mg | ORAL_CAPSULE | Freq: Three times a day (TID) | ORAL | Status: DC | PRN
Start: 2015-12-24 — End: 2018-01-04

## 2015-12-24 MED ORDER — PREDNISONE 20 MG PO TABS
60.0000 mg | ORAL_TABLET | Freq: Once | ORAL | Status: AC
  Administered 2015-12-24: 60 mg via ORAL
  Filled 2015-12-24: qty 3

## 2015-12-24 MED ORDER — IPRATROPIUM-ALBUTEROL 0.5-2.5 (3) MG/3ML IN SOLN
3.0000 mL | Freq: Once | RESPIRATORY_TRACT | Status: AC
  Administered 2015-12-24: 3 mL via RESPIRATORY_TRACT
  Filled 2015-12-24: qty 3

## 2015-12-24 MED ORDER — PREDNISONE 20 MG PO TABS: 60.0000 mg | ORAL_TABLET | Freq: Every day | ORAL | Status: DC

## 2015-12-24 MED ORDER — ALBUTEROL SULFATE HFA 108 (90 BASE) MCG/ACT IN AERS: 2.0000 | INHALATION_SPRAY | Freq: Four times a day (QID) | RESPIRATORY_TRACT | Status: DC | PRN

## 2015-12-24 MED ORDER — BENZONATATE 100 MG PO CAPS
100.0000 mg | ORAL_CAPSULE | Freq: Once | ORAL | Status: AC
  Administered 2015-12-24: 100 mg via ORAL
  Filled 2015-12-24: qty 1

## 2015-12-24 NOTE — ED Notes (Signed)
Pt discharged to home.  Discharge instructions reviewed.  Verbalized understanding.  No questions or concerns at this time.  Teach back verified.  Pt in NAD.  No items left in ED.   

## 2015-12-24 NOTE — Discharge Instructions (Signed)
Upper Respiratory Infection, Adult Most upper respiratory infections (URIs) are a viral infection of the air passages leading to the lungs. A URI affects the nose, throat, and upper air passages. The most common type of URI is nasopharyngitis and is typically referred to as "the common cold." URIs run their course and usually go away on their own. Most of the time, a URI does not require medical attention, but sometimes a bacterial infection in the upper airways can follow a viral infection. This is called a secondary infection. Sinus and middle ear infections are common types of secondary upper respiratory infections. Bacterial pneumonia can also complicate a URI. A URI can worsen asthma and chronic obstructive pulmonary disease (COPD). Sometimes, these complications can require emergency medical care and may be life threatening.  CAUSES Almost all URIs are caused by viruses. A virus is a type of germ and can spread from one person to another.  RISKS FACTORS You may be at risk for a URI if:   You smoke.   You have chronic heart or lung disease.  You have a weakened defense (immune) system.   You are very young or very old.   You have nasal allergies or asthma.  You work in crowded or poorly ventilated areas.  You work in health care facilities or schools. SIGNS AND SYMPTOMS  Symptoms typically develop 2-3 days after you come in contact with a cold virus. Most viral URIs last 7-10 days. However, viral URIs from the influenza virus (flu virus) can last 14-18 days and are typically more severe. Symptoms may include:   Runny or stuffy (congested) nose.   Sneezing.   Cough.   Sore throat.   Headache.   Fatigue.   Fever.   Loss of appetite.   Pain in your forehead, behind your eyes, and over your cheekbones (sinus pain).  Muscle aches.  DIAGNOSIS  Your health care provider may diagnose a URI by:  Physical exam.  Tests to check that your symptoms are not due to  another condition such as:  Strep throat.  Sinusitis.  Pneumonia.  Asthma. TREATMENT  A URI goes away on its own with time. It cannot be cured with medicines, but medicines may be prescribed or recommended to relieve symptoms. Medicines may help:  Reduce your fever.  Reduce your cough.  Relieve nasal congestion. HOME CARE INSTRUCTIONS   Take medicines only as directed by your health care provider.   Gargle warm saltwater or take cough drops to comfort your throat as directed by your health care provider.  Use a warm mist humidifier or inhale steam from a shower to increase air moisture. This may make it easier to breathe.  Drink enough fluid to keep your urine clear or pale yellow.   Eat soups and other clear broths and maintain good nutrition.   Rest as needed.   Return to work when your temperature has returned to normal or as your health care provider advises. You may need to stay home longer to avoid infecting others. You can also use a face mask and careful hand washing to prevent spread of the virus.  Increase the usage of your inhaler if you have asthma.   Do not use any tobacco products, including cigarettes, chewing tobacco, or electronic cigarettes. If you need help quitting, ask your health care provider. PREVENTION  The best way to protect yourself from getting a cold is to practice good hygiene.   Avoid oral or hand contact with people with cold   symptoms.   Wash your hands often if contact occurs.  There is no clear evidence that vitamin C, vitamin E, echinacea, or exercise reduces the chance of developing a cold. However, it is always recommended to get plenty of rest, exercise, and practice good nutrition.  SEEK MEDICAL CARE IF:   You are getting worse rather than better.   Your symptoms are not controlled by medicine.   You have chills.  You have worsening shortness of breath.  You have brown or red mucus.  You have yellow or brown nasal  discharge.  You have pain in your face, especially when you bend forward.  You have a fever.  You have swollen neck glands.  You have pain while swallowing.  You have white areas in the back of your throat. SEEK IMMEDIATE MEDICAL CARE IF:   You have severe or persistent:  Headache.  Ear pain.  Sinus pain.  Chest pain.  You have chronic lung disease and any of the following:  Wheezing.  Prolonged cough.  Coughing up blood.  A change in your usual mucus.  You have a stiff neck.  You have changes in your:  Vision.  Hearing.  Thinking.  Mood. MAKE SURE YOU:   Understand these instructions.  Will watch your condition.  Will get help right away if you are not doing well or get worse.   This information is not intended to replace advice given to you by your health care provider. Make sure you discuss any questions you have with your health care provider.   Document Released: 04/20/2001 Document Revised: 03/11/2015 Document Reviewed: 01/30/2014 Elsevier Interactive Patient Education 2016 Elsevier Inc.  

## 2015-12-24 NOTE — ED Provider Notes (Signed)
[facility] Emergency Department Provider Note  ____________________________________________  Time seen: Approximately 216 AM  I have reviewed the triage vital signs and the nursing notes.   HISTORY  Chief Complaint Cough    HPI Rebekah Jacobs is a 40 y.o. female who comes into the hospital today with fevers, cold sweats and a bad cough. The patient reports the symptoms started on Sunday night. The patient reports that she's had fevers with her highest temperature of 102 this afternoon. She reports that the symptoms come and go and it won't stay away. She has been taking ibuprofen at home but came in tonight because she felt like she was having some trouble breathing due to the bad cough. She reports that her nephew has been sick and someone at church has also been sick. She reports that she has some discomfort with cough but no significant pain. The patient has had some chills and body aches. She rates her discomfort a 5 out of 10 in intensity.   Past Medical History  Diagnosis Date  . Diabetes mellitus, type II (HCC)   . Depression   . Headache   . PTSD (post-traumatic stress disorder)   . Hypertension 07/16/2015  . Psychosis   . Fatigue     Patient Active Problem List   Diagnosis Date Noted  . 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 PTSD (post-traumatic stress disorder) 07/16/2015  . Depression, major, recurrent, severe with psychosis (HCC) 07/16/2015  . GAD (generalized anxiety disorder) 07/16/2015    No past surgical history on file.  Current Outpatient Rx  Name  Route  Sig  Dispense  Refill  . albuterol (PROVENTIL HFA;VENTOLIN HFA) 108 (90 Base) MCG/ACT inhaler   Inhalation   Inhale 2 puffs into the lungs every 6 (six) hours as needed.   1 Inhaler   0   . benzonatate (TESSALON PERLES) 100 MG capsule   Oral   Take 1 capsule (100 mg total) by mouth 3 (three) times daily as needed for cough.   15 capsule   0   .  chlorpheniramine-HYDROcodone (TUSSIONEX PENNKINETIC ER) 10-8 MG/5ML SUER   Oral   Take 5 mLs by mouth 2 (two) times daily.   115 mL   0   . escitalopram (LEXAPRO) 20 MG tablet   Oral   Take 1 tablet (20 mg total) by mouth daily.   30 tablet   2   . gabapentin (NEURONTIN) 300 MG capsule   Oral   Take 300 mg by mouth 3 (three) times daily.         Marland Kitchen ibuprofen (ADVIL,MOTRIN) 800 MG tablet   Oral   Take 1 tablet (800 mg total) by mouth every 8 (eight) hours as needed for moderate pain.   15 tablet   0   . lisinopril (PRINIVIL,ZESTRIL) 5 MG tablet   Oral   Take 10 mg by mouth at bedtime.         . lovastatin (MEVACOR) 20 MG tablet   Oral   Take 20 mg by mouth at bedtime.         . metFORMIN (GLUCOPHAGE-XR) 500 MG 24 hr tablet   Oral   Take 500 mg by mouth. At bedtime         . naproxen (NAPROSYN) 500 MG tablet   Oral   Take 500 mg by mouth at bedtime.         . predniSONE (DELTASONE) 20 MG tablet   Oral   Take 3  tablets (60 mg total) by mouth daily.   12 tablet   0   . pseudoephedrine (SUDAFED) 120 MG 12 hr tablet   Oral   Take 1 tablet (120 mg total) by mouth 2 (two) times daily as needed for congestion.   20 tablet   0   . risperiDONE (RISPERDAL) 1 MG tablet   Oral   Take 1 tablet (1 mg total) by mouth at bedtime. Take half a tablet at bedtime   30 tablet   2     Allergies Shellfish allergy and Peanuts  Family History  Problem Relation Age of Onset  . Depression Mother   . Depression Father   . Alcohol abuse Father   . Anxiety disorder Father   . Depression Daughter     Social History Social History  Substance Use Topics  . Smoking status: Former Games developer  . Smokeless tobacco: None  . Alcohol Use: No    Review of Systems Constitutional:  fever/chills Eyes: No visual changes. ENT: No sore throat. Cardiovascular: Denies chest pain. Respiratory: Cough and shortness of breath. Gastrointestinal: No abdominal pain.  No nausea, no  vomiting.  No diarrhea.  No constipation. Genitourinary: Negative for dysuria. Musculoskeletal: Negative for back pain. Skin: Negative for rash. Neurological: Negative for headaches, focal weakness or numbness.  10-point ROS otherwise negative.  ____________________________________________   PHYSICAL EXAM:  VITAL SIGNS: ED Triage Vitals  Enc Vitals Group     BP 12/23/15 2340 147/78 mmHg     Pulse Rate 12/23/15 2340 116     Resp 12/23/15 2340 22     Temp 12/23/15 2340 98.8 F (37.1 C)     Temp Source 12/23/15 2340 Oral     SpO2 12/23/15 2340 98 %     Weight 12/23/15 2340 325 lb (147.419 kg)     Height 12/23/15 2340 5\' 7"  (1.702 m)     Head Cir --      Peak Flow --      Pain Score 12/24/15 0201 5     Pain Loc --      Pain Edu? --      Excl. in GC? --     Constitutional: Alert and oriented. Well appearing and in mild distress. Eyes: Conjunctivae are normal. PERRL. EOMI. Head: Atraumatic. Nose: No congestion/rhinnorhea. Mouth/Throat: Mucous membranes are moist.  Oropharynx non-erythematous. Cardiovascular: Tachycardia, regular rhythm. Grossly normal heart sounds.  Good peripheral circulation. Respiratory: Normal respiratory effort.  No retractions. Lungs CTAB. Gastrointestinal: Soft and nontender. No distention. Positive bowel sounds Musculoskeletal: No lower extremity tenderness nor edema.   Neurologic:  Normal speech and language.  Skin:  Skin is warm, dry and intact.  Psychiatric: Mood and affect are normal.   ____________________________________________   LABS (all labs ordered are listed, but only abnormal results are displayed)  Labs Reviewed - No data to display ____________________________________________  EKG  None ____________________________________________  RADIOLOGY  Chest x-ray: No active cardiopulmonary disease ____________________________________________   PROCEDURES  Procedure(s) performed: None  Critical Care performed:  No  ____________________________________________   INITIAL IMPRESSION / ASSESSMENT AND PLAN / ED COURSE  Pertinent labs & imaging results that were available during my care of the patient were reviewed by me and considered in my medical decision making (see chart for details).  This is a 40 year old female who comes into the hospital today with cough and fevers as well as some chills and cold sweats. The patient does not have pneumonia on her chest x-ray and likely has bronchitis  given her symptoms. The patient is not febrile here but I she is tachycardic. I will give the patient a DuoNeb in the event her cough is induced by bronchospasm and give her some prednisone as well as benzonatate. I will also have the patient orally hydrate and reassess the patient. The patient is appearing well at this time.  The patient did orally hydrate and her heart rate is down to 100. The patient reports that she feels better and she is not coughing severely. The patient will be discharged home to follow-up with the acute care clinic. ____________________________________________   FINAL CLINICAL IMPRESSION(S) / ED DIAGNOSES  Final diagnoses:  Bronchitis      Rebecka Apley, MD 12/24/15 [phone]

## 2017-12-16 ENCOUNTER — Other Ambulatory Visit: Payer: Self-pay | Admitting: Obstetrics and Gynecology

## 2017-12-19 ENCOUNTER — Other Ambulatory Visit: Payer: Self-pay | Admitting: Obstetrics and Gynecology

## 2017-12-19 DIAGNOSIS — R042 Hemoptysis: Secondary | ICD-10-CM

## 2017-12-22 ENCOUNTER — Ambulatory Visit (INDEPENDENT_AMBULATORY_CARE_PROVIDER_SITE_OTHER): Payer: Medicare Other | Admitting: Obstetrics & Gynecology

## 2017-12-22 ENCOUNTER — Encounter: Payer: Self-pay | Admitting: Obstetrics & Gynecology

## 2017-12-22 VITALS — BP 150/90 | HR 133 | Ht 66.0 in | Wt 340.0 lb

## 2017-12-22 DIAGNOSIS — Z803 Family history of malignant neoplasm of breast: Secondary | ICD-10-CM | POA: Diagnosis not present

## 2017-12-22 DIAGNOSIS — R102 Pelvic and perineal pain unspecified side: Secondary | ICD-10-CM | POA: Insufficient documentation

## 2017-12-22 DIAGNOSIS — Z8041 Family history of malignant neoplasm of ovary: Secondary | ICD-10-CM

## 2017-12-22 DIAGNOSIS — N926 Irregular menstruation, unspecified: Secondary | ICD-10-CM

## 2017-12-22 NOTE — Patient Instructions (Signed)
BRCA Gene Testing Why am I having this test? BRCA gene testing is done to check for the presence of harmful changes (mutations) in the BRCA1 gene or the BRCA2 gene (breast cancer susceptibility genes). If there is a mutation, the genes may not be able to help repair damaged cells in the body. As a result, the damaged cells may develop defects that can lead to certain types of cancer. You may have this test if you have a family history of certain types of cancer, including cancer of the:  Breast.  Ovaries.  Fallopian tubes.  Peritoneum.  Pancreas.  Prostate.  What kind of sample is taken? The test requires either a sample of blood or a sample of cells from your saliva. If a sample of blood is needed, it will probably be collected by inserting a needle into a vein. If a sample of saliva is needed, you will get instructions about how to collect the sample. What do the results mean? The test results can show whether you have a mutation in the BRCA1 or BRCA2 gene that increases your risk for certain cancers. Meaning of negative test results A negative test result means that you do not have a mutation in the BRCA1 or BRCA2 gene that is known to increase your risk for certain cancers. This does not mean that you will never get cancer. Talk with your health care provider or a genetic counselor about what this result means for you. Meaning of positive test results A positive test result means that you have a mutation in the BRCA1 or BRCA2 gene that increases your risk for certain cancers. Women with a positive test result have an increased risk for breast and ovarian cancer. Both women and men with a mutation have an increased risk for breast cancer and may be at greater risk for other types of cancer. Getting a positive test result does not mean that you will develop cancer. Talk with your health care provider or a genetic counselor about what this result means for you. You may be told that you are  a carrier. This means that you can pass the mutation to your children. Meaning of ambiguous test results Ambiguous, inconclusive, or uncertain test results mean that there is a change in the BRCA1 or BRCA2 gene, but it is a change that has not been linked to cancer. Talk with your health care provider or a genetic counselor about what this result means for you. Talk with your health care provider to discuss your results, treatment options, and if necessary, the need for more tests. Talk with your health care provider if you have any questions about your results. How do I get my results? It is up to you to get your test results. Ask your health care provider, or the department that is doing the test, when your results will be ready. This information is not intended to replace advice given to you by your health care provider. Make sure you discuss any questions you have with your health care provider. Document Released: 11/18/2004 Document Revised: 06/28/2016 Document Reviewed: 06/16/2016 Elsevier Interactive Patient Education  2018 Elsevier Inc.  

## 2017-12-22 NOTE — Progress Notes (Addendum)
Abdominal Pain Patient presents for evaluation of abdominal pain. The pain is described as aching and stabbing, and is 7/10 in intensity. Pain is located in the suprapubic area without radiation. Onset was gradual occurring 1 year ago. Symptoms have been gradually worsening since. Aggravating factors: activity. Alleviating factors: acetaminophen. Associated symptoms: none. The patient denies chills, constipation, diarrhea and fever. Risk factors for pelvic/abdominal pain include prior uterine ablation 2005.  Prior BTL.  Prior NSVD.  Also had D&C 4 years ago for pain and findings at [place] c/w hematometria/pyometria; treated w surgery and ABX.  Additional Complaints: Dysfunctional Uterine Bleeding Patient complains of irregular menses. She had been bleeding irregularly. She is now bleeding few times per month and menses are lasting a few days. Not heavy.  Dysmenorrhea:yes. Cyclic symptoms include: none. Current contraception: tubal ligation.  Recent PAP normal.  PMHx: She  has a past medical history of Depression, Diabetes mellitus, type II ([place]), Fatigue, Headache, Hypertension (07/16/2015), Psychosis ([place]), and PTSD (post-traumatic stress disorder). Also,  has no past surgical history on file., family history includes Alcohol abuse in her father; Anxiety disorder in her father; Breast cancer in her mother; Cancer in her father and mother; Depression in her daughter, father, and mother; Diabetes in her paternal grandmother.,  reports that she has quit smoking. she has never used smokeless tobacco. She reports that she uses drugs. Drug: Marijuana. She reports that she does not drink alcohol.  She has a current medication list which includes the following prescription(s): albuterol, benzonatate, chlorpheniramine-hydrocodone, escitalopram, gabapentin, ibuprofen, lisinopril, lovastatin, metformin, naproxen, prednisone, pseudoephedrine, and risperidone. Also, is allergic to shellfish allergy and peanuts [peanut  oil].  Review of Systems  Constitutional: Negative for chills, fever and malaise/fatigue.  HENT: Negative for congestion, sinus pain and sore throat.   Eyes: Negative for blurred vision and pain.  Respiratory: Negative for cough and wheezing.   Cardiovascular: Negative for chest pain and leg swelling.  Gastrointestinal: Negative for abdominal pain, constipation, diarrhea, heartburn, nausea and vomiting.  Genitourinary: Negative for dysuria, frequency, hematuria and urgency.  Musculoskeletal: Negative for back pain, joint pain, myalgias and neck pain.  Skin: Negative for itching and rash.  Neurological: Negative for dizziness, tremors and weakness.  Endo/Heme/Allergies: Does not bruise/bleed easily.  Psychiatric/Behavioral: Negative for depression. The patient is not nervous/anxious and does not have insomnia.     Objective: BP (!) 150/90   Pulse (!) 133   Ht 5' 6"  (1.676 m)   Wt (!) 340 lb (154.2 kg)   LMP 12/08/2017   BMI 54.88 kg/m  Physical Exam  Constitutional: She is oriented to person, place, and time. She appears well-developed and well-nourished. No distress.  Obesity limiting exam  Genitourinary: Rectum normal, vagina normal and uterus normal. Pelvic exam was performed with patient supine. There is no rash or lesion on the right labia. There is no rash or lesion on the left labia. Vagina exhibits no lesion. No bleeding in the vagina. Right adnexum does not display mass and does not display tenderness. Left adnexum does not display mass and does not display tenderness. Cervix does not exhibit motion tenderness, lesion, friability or polyp.   Uterus is mobile and midaxial. Uterus is not enlarged or exhibiting a mass.  HENT:  Head: Normocephalic and atraumatic. Head is without laceration.  Right Ear: Hearing normal.  Left Ear: Hearing normal.  Nose: No epistaxis.  No foreign bodies.  Mouth/Throat: Uvula is midline, oropharynx is clear and moist and mucous membranes are  normal.  Eyes: Pupils  are equal, round, and reactive to light.  Neck: Normal range of motion. Neck supple. No thyromegaly present.  Cardiovascular: Normal rate and regular rhythm. Exam reveals no gallop and no friction rub.  No murmur heard. Pulmonary/Chest: Effort normal and breath sounds normal. No respiratory distress. She has no wheezes. Right breast exhibits no mass, no skin change and no tenderness. Left breast exhibits no mass, no skin change and no tenderness.  Abdominal: Soft. Bowel sounds are normal. She exhibits no distension. There is no tenderness. There is no rebound.  Musculoskeletal: Normal range of motion.  Neurological: She is alert and oriented to person, place, and time. No cranial nerve deficit.  Skin: Skin is warm and dry.  Psychiatric: She has a normal mood and affect. Judgment normal.  Vitals reviewed.  ASSESSMENT/PLAN:   Problem List Items Addressed This Visit      Other   Pelvic pain in female - Primary   Relevant Orders   US PELVIS TRANSVANGINAL NON-OB (TV ONLY)   Irregular menses   Relevant Orders   US PELVIS TRANSVANGINAL NON-OB (TV ONLY)   Family history of breast cancer   Relevant Orders   [facility] Breast and GYN Cancer   Family history of ovarian cancer   Relevant Orders   [facility] Breast and GYN Cancer     She presents with a significant personal and/or family history of BREAST (MOM 61) and OVARIAN (MGM, MA young ages). Details of which can be found in her medical/family history. She does not have a previously identified BRCA and Lynch syndrome mutation in her family. Due to her personal and/or family history of cancer she is a candidate for the [facility] test(s).    Risk for cancer, genetic susceptibility discussed.  Patient has requested gene testing.  Discussed BRCA as well as Lynch syndrome and other cancer risk assessments available based on her family history and personal history. Pros and cons of testing discussed.   Barnett Applebaum, MD,  [facility], [facility] 12/22/2017  2:12 PM

## 2018-01-02 ENCOUNTER — Ambulatory Visit (INDEPENDENT_AMBULATORY_CARE_PROVIDER_SITE_OTHER): Payer: Medicare Other

## 2018-01-02 ENCOUNTER — Encounter: Payer: Self-pay | Admitting: Obstetrics & Gynecology

## 2018-01-02 ENCOUNTER — Ambulatory Visit (INDEPENDENT_AMBULATORY_CARE_PROVIDER_SITE_OTHER): Payer: Medicare Other | Admitting: Obstetrics & Gynecology

## 2018-01-02 VITALS — BP 128/80 | HR 108 | Ht 66.0 in | Wt 338.0 lb

## 2018-01-02 DIAGNOSIS — R102 Pelvic and perineal pain: Secondary | ICD-10-CM | POA: Diagnosis not present

## 2018-01-02 DIAGNOSIS — Z8041 Family history of malignant neoplasm of ovary: Secondary | ICD-10-CM

## 2018-01-02 DIAGNOSIS — N8003 Adenomyosis of the uterus: Secondary | ICD-10-CM

## 2018-01-02 DIAGNOSIS — N926 Irregular menstruation, unspecified: Secondary | ICD-10-CM

## 2018-01-02 DIAGNOSIS — N800 Endometriosis of uterus: Secondary | ICD-10-CM | POA: Diagnosis not present

## 2018-01-02 DIAGNOSIS — N809 Endometriosis, unspecified: Principal | ICD-10-CM

## 2018-01-02 NOTE — Patient Instructions (Signed)
Total Laparoscopic Hysterectomy °A total laparoscopic hysterectomy is a minimally invasive surgery to remove your uterus and cervix. This surgery is performed by making several small cuts (incisions) in your abdomen. It can also be done with a thin, lighted tube (laparoscope) inserted into two small incisions in your lower abdomen. Your fallopian tubes and ovaries can be removed (bilateral salpingo-oophorectomy) during this surgery as well. Benefits of minimally invasive surgery include: °· Less pain. °· Less risk of blood loss. °· Less risk of infection. °· Quicker return to normal activities. ° °Tell a health care provider about: °· Any allergies you have. °· All medicines you are taking, including vitamins, herbs, eye drops, creams, and over-the-counter medicines. °· Any problems you or family members have had with anesthetic medicines. °· Any blood disorders you have. °· Any surgeries you have had. °· Any medical conditions you have. °What are the risks? °Generally, this is a safe procedure. However, as with any procedure, complications can occur. Possible complications include: °· Bleeding. °· Blood clots in the legs or lung. °· Infection. °· Injury to surrounding organs. °· Problems with anesthesia. °· Early menopause symptoms (hot flashes, night sweats, insomnia). °· Risk of conversion to an open abdominal incision. ° °What happens before the procedure? °· Ask your health care provider about changing or stopping your regular medicines. °· Do not take aspirin or blood thinners (anticoagulants) for 1 week before the surgery or as told by your health care provider. °· Do not eat or drink anything for 8 hours before the surgery or as told by your health care provider. °· Quit smoking if you smoke. °· Arrange for a ride home after surgery and for someone to help you at home during recovery. °What happens during the procedure? °· You will be given antibiotic medicine. °· An IV tube will be placed in your arm. You  will be given medicine to make you sleep (general anesthetic). °· A gas (carbon dioxide) will be used to inflate your abdomen. This will allow your surgeon to look inside your abdomen, perform your surgery, and treat any other problems found if necessary. °· Three or four small incisions (often less than 1/2 inch) will be made in your abdomen. One of these incisions will be made in the area of your belly button (navel). The laparoscope will be inserted into the incision. Your surgeon will look through the laparoscope while doing your procedure. °· Other surgical instruments will be inserted through the other incisions. °· Your uterus may be removed through your vagina or cut into small pieces and removed through the small incisions. °· Your incisions will be closed. °What happens after the procedure? °· The gas will be released from inside your abdomen. °· You will be taken to the recovery area where a nurse will watch and check your progress. Once you are awake, stable, and taking fluids well, without other problems, you will return to your room or be allowed to go home. °· There is usually minimal discomfort following the surgery because the incisions are so small. °· You will be given pain medicine while you are in the hospital and for when you go home. °This information is not intended to replace advice given to you by your health care provider. Make sure you discuss any questions you have with your health care provider. °Document Released: 08/22/2007 Document Revised: 04/01/2016 Document Reviewed: 05/15/2013 °Elsevier Interactive Patient Education © 2017 [employer] ° °

## 2018-01-02 NOTE — Progress Notes (Signed)
HPI: Pain and bleeding, as before. The pain is described as aching and stabbing, and is 7/10 in intensity. Pain is located in the suprapubic area without radiation. Onset was gradual occurring 1 year ago. Symptoms have been gradually worsening since. Aggravating factors: activity. Alleviating factors: acetaminophen. Associated symptoms: none. The patient denies chills, constipation, diarrhea and fever. Risk factors for pelvic/abdominal pain include prior uterine ablation 2005.  Prior BTL.  Prior NSVD.  Also had D&C 4 years ago for pain and findings at [place] c/w hematometria/pyometria; treated w surgery and ABX. Patient also complains of irregular menses. She had been bleeding irregularly. She is now bleeding few times per month and menses are lasting a few days. Not heavy.  Dysmenorrhea:yes. Cyclic symptoms include: none. Current contraception: tubal ligation  Ultrasound demonstrates adenomyosis.  No fibroid, cyst, FF.  PMHx: She  has a past medical history of Depression, Diabetes mellitus, type II (HCC), Fatigue, Headache, Hypertension (07/16/2015), Psychosis (HCC), and PTSD (post-traumatic stress disorder). Also,  has no past surgical history on file., family history includes Alcohol abuse in her father; Anxiety disorder in her father; Breast cancer in her mother; Cancer in her father and mother; Depression in her daughter, father, and mother; Diabetes in her paternal grandmother.,  reports that she has quit smoking. she has never used smokeless tobacco. She reports that she uses drugs. Drug: Marijuana. She reports that she does not drink alcohol.  She has a current medication list which includes the following prescription(s): albuterol, benzonatate, chlorpheniramine-hydrocodone, escitalopram, gabapentin, ibuprofen, lisinopril, lovastatin, metformin, naproxen, prednisone, pseudoephedrine, and risperidone. Also, is allergic to shellfish allergy and peanuts [peanut oil].  Review of Systems  All other  systems reviewed and are negative.  Objective: BP 128/80   Pulse (!) 108   Ht 5\' 6"  (1.676 m)   Wt (!) 338 lb (153.3 kg)   LMP 12/08/2017   BMI 54.55 kg/m   Physical examination Constitutional NAD, Conversant  Skin No rashes, lesions or ulceration.   Extremities: Moves all appropriately.  Normal ROM for age. No lymphadenopathy.  Neuro: Grossly intact  Psych: Oriented to PPT.  Normal mood. Normal affect.   US Pelvis Transvanginal Non-ob (tv Only)  Result Date: 01/02/2018 ULTRASOUND REPORT Location: [facility] Date of Service: 01/02/2018 Indications:Irregular cycles and pelvic pain Findings: The uterus is retroverted and measures 8.12 x 5.56 x 4.79cm. Echo texture is heterogenous without evidence of focal masses, suspicious for Adenomyosis History significant for endometrial ablation in 2005. The Endometrium measures 6.55 mm. Right Ovary measures 1.72 x 1.90 x 1.13 cm. It is normal in appearance. Left Ovary is not visualized. Survey of the adnexa demonstrates no adnexal masses. There is no free fluid in the cul de sac. Impression: 1. Possible Adenomyosis Recommendations: 1.Clinical correlation with the patient's History and Physical Exam. Willette Alma, RDMS, RVT Review of ULTRASOUND.    I have personally reviewed images and report of recent ultrasound done at [facility].    Plan of management to be discussed with patient. Annamarie Major, MD, FACOG [facility], [facility] 01/02/2018  3:30 PM   Assessment:  Adenomyosis Family history of ovarian cancer Irregular menses Pelvic pain in female  All options d/w pt for treatment of pain and bleeding w Adenomyosis.    Pt elects for lap hysterectomy. Alternatives discussed. Risks based on obesity discussed.  Plan 01/17/18.  Info provided to pt.  Review of ULTRASOUND.    I have personally reviewed images and report of recent ultrasound done at [facility].  Plan of management to be discussed with patient.  Annamarie MajorPaul Everlyn Farabaugh, MD,  Merlinda FrederickFACOG [facility], [facility] 01/02/2018  3:31 PM

## 2018-01-03 ENCOUNTER — Telehealth: Payer: Self-pay | Admitting: Obstetrics & Gynecology

## 2018-01-03 NOTE — Telephone Encounter (Signed)
[facility] @ 2:50pm w/ Dr. Tiburcio PeaHarris, Pre-admit Testing phone interview to be scheduled, and OR on 01/17/18.

## 2018-01-03 NOTE — Telephone Encounter (Signed)
-----   Message from Nadara Mustardobert P Harris, MD sent at 01/02/2018  3:29 PM EST ----- Regarding: SURGERY Surgery Booking Request Patient Full Name:  Vernie MurdersDoris J Mccullum  MRN: [id number]  DOB: 21-Jul-1976  Surgeon: Letitia Libraobert Paul Harris, MD  Requested Surgery Date and Time: 01/17/18 Primary Diagnosis AND Code: N80.0, N92.6, R10.2 Secondary Diagnosis and Code: Z80.41 Surgical Procedure: TLH/BS L&D Notification: No Admission Status: same day surgery Length of Surgery: 1.5 hr Special Case Needs: no H&P: yes (date) Phone Interview???: yes Interpreter: Language:  Medical Clearance: no Special Scheduling Instructions: no

## 2018-01-10 ENCOUNTER — Ambulatory Visit (INDEPENDENT_AMBULATORY_CARE_PROVIDER_SITE_OTHER): Payer: Medicare Other | Admitting: Obstetrics & Gynecology

## 2018-01-10 ENCOUNTER — Encounter: Payer: Self-pay | Admitting: Obstetrics & Gynecology

## 2018-01-10 VITALS — BP 138/98 | HR 93 | Ht 66.0 in | Wt 344.0 lb

## 2018-01-10 DIAGNOSIS — N926 Irregular menstruation, unspecified: Secondary | ICD-10-CM

## 2018-01-10 DIAGNOSIS — N800 Endometriosis of uterus: Secondary | ICD-10-CM | POA: Diagnosis not present

## 2018-01-10 DIAGNOSIS — N809 Endometriosis, unspecified: Principal | ICD-10-CM

## 2018-01-10 DIAGNOSIS — N8003 Adenomyosis of the uterus: Secondary | ICD-10-CM

## 2018-01-10 NOTE — Patient Instructions (Signed)

## 2018-01-10 NOTE — Progress Notes (Signed)
PRE-OPERATIVE HISTORY AND PHYSICAL EXAM  HPI:  Rebekah Jacobs is a 42 y.o. G1P1001 No LMP recorded.; she is being admitted for surgery related to pelvic pain.  The pain is described asaching and stabbing, and is7/10 in intensity. Pain is located in thesuprapubicarea without radiation. Onset wasgradualoccurring 1yearago. Symptoms have been gradually worseningsince. Aggravating factors: activity. Alleviating factors:acetaminophen. Associated symptoms:none. The patient denieschills, constipation, diarrhea and fever.  Risk factors for pelvic/abdominal pain includeprior uterine ablation 2005. Prior BTL. Prior NSVD.Also had D&C 4 years ago for pain and findings at [place] c/w hematometria/pyometria; treated w surgery and ABX. Patient also complains of irregular menses. She had been bleedingirregularly. She is now bleedingfew times per monthand menses are lasting a fewdays. Not heavy.Dysmenorrhea:yes. Cyclic symptoms include:none. Current contraception:tubal ligation Ultrasound demonstrates adenomyosis.  No fibroid, cyst, FF.  PMHx: Past Medical History:  Diagnosis Date  . Depression   . Diabetes mellitus, type II (HCC)   . Fatigue   . Headache   . Hypertension 07/16/2015  . Psychosis (HCC)   . PTSD (post-traumatic stress disorder)    History reviewed. No pertinent surgical history. Family History  Problem Relation Age of Onset  . Depression Mother   . Breast cancer Mother   . Cancer Mother   . Depression Father   . Alcohol abuse Father   . Anxiety disorder Father   . Cancer Father   . Depression Daughter   . Diabetes Paternal Grandmother    Social History   Tobacco Use  . Smoking status: Former Games developer  . Smokeless tobacco: Never Used  Substance Use Topics  . Alcohol use: No  . Drug use: Yes    Types: Marijuana    Current Outpatient Medications:  .  albuterol (PROVENTIL HFA;VENTOLIN HFA) 108 (90 Base) MCG/ACT inhaler, Inhale 2 puffs into the lungs every 6  (six) hours as needed., Disp: 1 Inhaler, Rfl: 0 .  busPIRone (BUSPAR) 15 MG tablet, Take 1 tablet by mouth every evening., Disp: , Rfl:  .  gabapentin (NEURONTIN) 300 MG capsule, Take 300 mg by mouth 3 (three) times daily., Disp: , Rfl:  .  glipiZIDE (GLUCOTROL) 5 MG tablet, Take 5 mg by mouth 2 (two) times daily before a meal., Disp: , Rfl:  .  ibuprofen (ADVIL,MOTRIN) 800 MG tablet, Take 1 tablet (800 mg total) by mouth every 8 (eight) hours as needed for moderate pain., Disp: 15 tablet, Rfl: 0 .  lisinopril (PRINIVIL,ZESTRIL) 10 MG tablet, Take 10 mg by mouth at bedtime., Disp: , Rfl:  .  lovastatin (MEVACOR) 20 MG tablet, Take 20 mg by mouth at bedtime., Disp: , Rfl:  .  metFORMIN (GLUCOPHAGE-XR) 500 MG 24 hr tablet, Take 500 mg by mouth at bedtime. At bedtime, Disp: , Rfl:  .  montelukast (SINGULAIR) 10 MG tablet, Take 10 mg by mouth at bedtime., Disp: , Rfl:  .  naproxen (NAPROSYN) 500 MG tablet, Take 500 mg by mouth 2 (two) times daily as needed for mild pain. , Disp: , Rfl:  .  risperiDONE (RISPERDAL) 1 MG tablet, Take 1 tablet (1 mg total) by mouth at bedtime. Take half a tablet at bedtime (Patient taking differently: Take 0.5 mg by mouth at bedtime. Take half a tablet at bedtime), Disp: 30 tablet, Rfl: 2 Allergies: Shellfish allergy and Peanuts [peanut oil]  Review of Systems  Constitutional: Negative for chills, fever and malaise/fatigue.  HENT: Negative for congestion, sinus pain and sore throat.   Eyes: Negative for blurred vision and  pain.  Respiratory: Negative for cough and wheezing.   Cardiovascular: Negative for chest pain and leg swelling.  Gastrointestinal: Negative for abdominal pain, constipation, diarrhea, heartburn, nausea and vomiting.  Genitourinary: Negative for dysuria, frequency, hematuria and urgency.  Musculoskeletal: Negative for back pain, joint pain, myalgias and neck pain.  Skin: Negative for itching and rash.  Neurological: Negative for dizziness, tremors  and weakness.  Endo/Heme/Allergies: Does not bruise/bleed easily.  Psychiatric/Behavioral: Negative for depression. The patient is not nervous/anxious and does not have insomnia.     Objective: BP (!) 138/98   Pulse 93   Ht 5\' 6"  (1.676 m)   Wt (!) 344 lb (156 kg)   BMI 55.52 kg/m   Filed Weights   01/10/18 1453  Weight: (!) 344 lb (156 kg)   Physical Exam  Constitutional: She is oriented to person, place, and time. She appears well-developed and well-nourished. No distress.  Obesity limits exam  Genitourinary: Rectum normal, vagina normal and uterus normal. Pelvic exam was performed with patient supine. There is no rash or lesion on the right labia. There is no rash or lesion on the left labia. Vagina exhibits no lesion. No bleeding in the vagina. Right adnexum does not display mass and does not display tenderness. Left adnexum does not display mass and does not display tenderness. Cervix does not exhibit motion tenderness, lesion, friability or polyp.   Uterus is mobile and midaxial. Uterus is not enlarged or exhibiting a mass.  HENT:  Head: Normocephalic and atraumatic. Head is without laceration.  Right Ear: Hearing normal.  Left Ear: Hearing normal.  Nose: No epistaxis.  No foreign bodies.  Mouth/Throat: Uvula is midline, oropharynx is clear and moist and mucous membranes are normal.  Eyes: Pupils are equal, round, and reactive to light.  Neck: Normal range of motion. Neck supple. No thyromegaly present.  Cardiovascular: Normal rate and regular rhythm. Exam reveals no gallop and no friction rub.  No murmur heard. Pulmonary/Chest: Effort normal and breath sounds normal. No respiratory distress. She has no wheezes. Right breast exhibits no mass, no skin change and no tenderness. Left breast exhibits no mass, no skin change and no tenderness.  Abdominal: Soft. Bowel sounds are normal. She exhibits no distension. There is no tenderness. There is no rebound.  Musculoskeletal: Normal  range of motion.  Neurological: She is alert and oriented to person, place, and time. No cranial nerve deficit.  Skin: Skin is warm and dry.  Psychiatric: She has a normal mood and affect. Judgment normal.  Vitals reviewed.  Assessment: 1. Adenomyosis   2. Irregular menses   Options for management discussed.  Risks also discussed for surgery, especially in regards to obesity.  Preservation of ovaries discussed.  I have had a careful discussion with this patient about all the options available and the risk/benefits of each. I have fully informed this patient that surgery may subject her to a variety of discomforts and risks: She understands that most patients have surgery with little difficulty, but problems can happen ranging from minor to fatal. These include nausea, vomiting, pain, bleeding, infection, poor healing, hernia, or formation of adhesions. Unexpected reactions may occur from any drug or anesthetic given. Unintended injury may occur to other pelvic or abdominal structures such as Fallopian tubes, ovaries, bladder, ureter (tube from kidney to bladder), or bowel. Nerves going from the pelvis to the legs may be injured. Any such injury may require immediate or later additional surgery to correct the problem. Excessive blood loss requiring  transfusion is very unlikely but possible. Dangerous blood clots may form in the legs or lungs. Physical and sexual activity will be restricted in varying degrees for an indeterminate period of time but most often 2-6 weeks.  Finally, she understands that it is impossible to list every possible undesirable effect and that the condition for which surgery is done is not always cured or significantly improved, and in rare cases may be even worse.Ample time was given to answer all questions.  Annamarie MajorPaul Arrian Manson, MD, Merlinda FrederickFACOG [facility], [facility] 01/10/2018  3:10 PM

## 2018-01-11 ENCOUNTER — Encounter
Admission: RE | Admit: 2018-01-11 | Discharge: 2018-01-11 | Disposition: A | Discharge status: Home / Self Care | Payer: Medicare Other | Source: Ambulatory Visit | Attending: Obstetrics & Gynecology | Admitting: Obstetrics & Gynecology

## 2018-01-11 ENCOUNTER — Other Ambulatory Visit: Payer: Self-pay

## 2018-01-11 HISTORY — DX: Family history of other specified conditions: Z84.89

## 2018-01-11 HISTORY — DX: Gastro-esophageal reflux disease without esophagitis: K21.9

## 2018-01-11 HISTORY — DX: Pneumonia, unspecified organism: J18.9

## 2018-01-11 HISTORY — DX: Other complications of anesthesia, initial encounter: T88.59XA

## 2018-01-11 HISTORY — DX: Adverse effect of unspecified anesthetic, initial encounter: T41.45XA

## 2018-01-11 NOTE — Patient Instructions (Addendum)
Your procedure is scheduled on: 01-17-18 TUESDAY Report to Same Day Surgery 2nd floor medical mall [facility](Medical Mall Entrance-take elevator on left to 2nd floor.  Check in with surgery information desk.) To find out your arrival time please call [phone] between 1PM - 3PM on 01-16-18 MONDAY  Remember: Instructions that are not followed completely may result in serious medical risk, up to and including death, or upon the discretion of your surgeon and anesthesiologist your surgery may need to be rescheduled.    _x___ 1. Do not eat food after midnight the night before your procedure. NO GUM OR CANDY AFTER MIDNIGHT.  You may drink clear liquids up to 2 hours before you are scheduled to arrive at the hospital for your procedure.  Do not drink clear liquids within 2 hours of your scheduled arrival to the hospital.  Type 1 and type 2 diabetics should only drink water.     __x__ 2. No Alcohol for 24 hours before or after surgery.   __x__3. No Smoking or e-cigarettes for 24 prior to surgery.  Do not use any chewable tobacco products for at least 6 hour prior to surgery   ____  4. Bring all medications with you on the day of surgery if instructed.    __x__ 5. Notify your doctor if there is any change in your medical condition     (cold, fever, infections).    x___6. On the morning of surgery brush your teeth with toothpaste and water.  You may rinse your mouth with mouth wash if you wish.  Do not swallow any toothpaste or mouthwash.   Do not wear jewelry, make-up, hairpins, clips or nail polish.  Do not wear lotions, powders, or perfumes. You may wear deodorant.  Do not shave 48 hours prior to surgery. Men may shave face and neck.  Do not bring valuables to the hospital.    [facility] is not responsible for any belongings or valuables.               Contacts, dentures or bridgework may not be worn into surgery.  Leave your suitcase in the car. After surgery it may be brought to your  room.  For patients admitted to the hospital, discharge time is determined by your treatment team.  _  Patients discharged the day of surgery will not be allowed to drive home.  You will need someone to drive you home and stay with you the night of your procedure.    Please read over the following fact sheets that you were given:   [facility] Preparing for Surgery and or MRSA Information   _x___ TAKE THE FOLLOWING MEDICATION THE MORNING OF SURGERY WITH A SMALL SIP OF WATER. These include:  1. NONE  2.  3.  4.  5.  6.  ____Fleets enema or Magnesium Citrate as directed.   _x___ Use CHG Soap or sage wipes as directed on instruction sheet   _X___ Use inhalers on the day of surgery and bring to hospital day of surgery-USE YOUR ALBUTEROL INHALER AM OF SURGERY AND BRING TO HOSPITAL  __X__ Stop Metformin 2 days prior to surgery-LAST DOSE ON Saturday, MARCH 9TH  ____ Take 1/2 of usual insulin dose the night before surgery and none on the morning surgery.   ____ Follow recommendations from Cardiologist, Pulmonologist or PCP regarding  stopping Aspirin, Coumadin, Plavix ,Eliquis, Effient, or Pradaxa, and Pletal.  X____Stop Anti-inflammatories such as Advil, Aleve, Ibuprofen, Motrin, Naproxen, Naprosyn, Goodies powders or aspirin  products NOW-OK to take Tylenol    ____ Stop supplements until after surgery.   ____ Bring C-Pap to the hospital.

## 2018-01-12 ENCOUNTER — Ambulatory Visit
Admission: RE | Admit: 2018-01-12 | Discharge: 2018-01-12 | Disposition: A | Discharge status: Home / Self Care | Payer: Medicare Other | Source: Ambulatory Visit | Attending: Obstetrics and Gynecology | Admitting: Obstetrics and Gynecology

## 2018-01-12 ENCOUNTER — Encounter
Admission: RE | Admit: 2018-01-12 | Discharge: 2018-01-12 | Disposition: A | Discharge status: Home / Self Care | Payer: Medicare Other | Source: Ambulatory Visit | Attending: Obstetrics & Gynecology | Admitting: Obstetrics & Gynecology

## 2018-01-12 DIAGNOSIS — R042 Hemoptysis: Secondary | ICD-10-CM | POA: Diagnosis present

## 2018-01-12 DIAGNOSIS — K760 Fatty (change of) liver, not elsewhere classified: Secondary | ICD-10-CM | POA: Diagnosis not present

## 2018-01-12 LAB — POCT I-STAT CREATININE: Creatinine, Ser: 0.6 mg/dL (ref 0.44–1.00)

## 2018-01-12 LAB — CBC
HCT: 39.7 % (ref 35.0–47.0)
Hemoglobin: 13 g/dL (ref 12.0–16.0)
MCH: 27.7 pg (ref 26.0–34.0)
MCHC: 32.7 g/dL (ref 32.0–36.0)
MCV: 84.7 fL (ref 80.0–100.0)
PLATELETS: 292 10*3/uL (ref 150–440)
RBC: 4.69 MIL/uL (ref 3.80–5.20)
RDW: 14.3 % (ref 11.5–14.5)
WBC: 4.1 10*3/uL (ref 3.6–11.0)

## 2018-01-12 LAB — BASIC METABOLIC PANEL
Anion gap: 11 (ref 5–15)
BUN: 9 mg/dL (ref 6–20)
CALCIUM: 8.8 mg/dL — AB (ref 8.9–10.3)
CO2: 26 mmol/L (ref 22–32)
Chloride: 104 mmol/L (ref 101–111)
Creatinine, Ser: 0.6 mg/dL (ref 0.44–1.00)
GFR calc non Af Amer: >60 mL/min (ref >60)
Glucose, Bld: 236 mg/dL — ABNORMAL HIGH (ref 65–99)
Potassium: 3.9 mmol/L (ref 3.5–5.1)
Sodium: 141 mmol/L (ref 135–145)

## 2018-01-12 LAB — TYPE AND SCREEN
ABO/RH(D): A POS
Antibody Screen: NEGATIVE

## 2018-01-12 MED ORDER — IOPAMIDOL (ISOVUE-370) INJECTION 76%
75.0000 mL | Freq: Once | INTRAVENOUS | Status: AC | PRN
  Administered 2018-01-12: 75 mL via INTRAVENOUS

## 2018-01-12 NOTE — Progress Notes (Signed)
Check on medical clearance due to diabetes, as blood sugar high on preop labs

## 2018-01-13 NOTE — Progress Notes (Signed)
Lmtrc for [employer] @ Pre-admit Testing.

## 2018-01-16 ENCOUNTER — Encounter: Payer: Self-pay | Admitting: *Deleted

## 2018-01-16 LAB — VISTASEQ BREAST AND GYN CANCER: Result Summary: NEGATIVE

## 2018-01-17 ENCOUNTER — Ambulatory Visit: Payer: Medicare Other | Admitting: Anesthesiology

## 2018-01-17 ENCOUNTER — Observation Stay
Admission: RE | Admit: 2018-01-17 | Discharge: 2018-01-18 | Disposition: A | Discharge status: Home / Self Care | Payer: Medicare Other | Source: Ambulatory Visit | Attending: Obstetrics & Gynecology | Admitting: Obstetrics & Gynecology

## 2018-01-17 ENCOUNTER — Encounter
Admission: RE | Disposition: A | Discharge status: Home / Self Care | Payer: Self-pay | Source: Ambulatory Visit | Attending: Obstetrics & Gynecology

## 2018-01-17 ENCOUNTER — Other Ambulatory Visit: Payer: Self-pay

## 2018-01-17 DIAGNOSIS — R102 Pelvic and perineal pain unspecified side: Secondary | ICD-10-CM | POA: Diagnosis present

## 2018-01-17 DIAGNOSIS — N800 Endometriosis of uterus: Secondary | ICD-10-CM | POA: Diagnosis not present

## 2018-01-17 DIAGNOSIS — Z8041 Family history of malignant neoplasm of ovary: Secondary | ICD-10-CM | POA: Diagnosis not present

## 2018-01-17 DIAGNOSIS — Z79899 Other long term (current) drug therapy: Secondary | ICD-10-CM | POA: Insufficient documentation

## 2018-01-17 DIAGNOSIS — N926 Irregular menstruation, unspecified: Secondary | ICD-10-CM | POA: Diagnosis not present

## 2018-01-17 DIAGNOSIS — R042 Hemoptysis: Secondary | ICD-10-CM

## 2018-01-17 DIAGNOSIS — N8003 Adenomyosis of the uterus: Secondary | ICD-10-CM | POA: Diagnosis present

## 2018-01-17 DIAGNOSIS — N809 Endometriosis, unspecified: Secondary | ICD-10-CM

## 2018-01-17 HISTORY — PX: CYSTOSCOPY: SHX5120

## 2018-01-17 HISTORY — PX: LAPAROSCOPIC HYSTERECTOMY: SHX1926

## 2018-01-17 LAB — URINE DRUG SCREEN, QUALITATIVE (ARMC ONLY)
AMPHETAMINES, UR SCREEN: NOT DETECTED
Barbiturates, Ur Screen: NOT DETECTED
Benzodiazepine, Ur Scrn: NOT DETECTED
COCAINE METABOLITE, UR ~~LOC~~: NOT DETECTED
Cannabinoid 50 Ng, Ur ~~LOC~~: NOT DETECTED
MDMA (ECSTASY) UR SCREEN: NOT DETECTED
Methadone Scn, Ur: NOT DETECTED
OPIATE, UR SCREEN: NOT DETECTED
PHENCYCLIDINE (PCP) UR S: NOT DETECTED
Tricyclic, Ur Screen: NOT DETECTED

## 2018-01-17 LAB — POCT PREGNANCY, URINE: PREG TEST UR: NEGATIVE

## 2018-01-17 LAB — GLUCOSE, CAPILLARY
Glucose-Capillary: 169 mg/dL — ABNORMAL HIGH (ref 65–99)
Glucose-Capillary: 200 mg/dL — ABNORMAL HIGH (ref 65–99)

## 2018-01-17 LAB — ABO/RH: ABO/RH(D): A POS

## 2018-01-17 LAB — PREGNANCY, URINE: PREG TEST UR: NEGATIVE

## 2018-01-17 SURGERY — HYSTERECTOMY, TOTAL, LAPAROSCOPIC
Anesthesia: General | Laterality: Bilateral

## 2018-01-17 MED ORDER — BUPIVACAINE HCL (PF) 0.5 % IJ SOLN
INTRAMUSCULAR | Status: AC
  Filled 2018-01-17: qty 30

## 2018-01-17 MED ORDER — RISPERIDONE 0.5 MG PO TABS
0.5000 mg | ORAL_TABLET | Freq: Every day | ORAL | Status: DC
  Administered 2018-01-17: 0.5 mg via ORAL
  Filled 2018-01-17: qty 1

## 2018-01-17 MED ORDER — ACETAMINOPHEN 325 MG PO TABS: 650.0000 mg | ORAL_TABLET | ORAL | Status: DC | PRN

## 2018-01-17 MED ORDER — ONDANSETRON HCL 4 MG/2ML IJ SOLN
INTRAMUSCULAR | Status: DC | PRN
  Administered 2018-01-17: 4 mg via INTRAVENOUS

## 2018-01-17 MED ORDER — LIDOCAINE HCL (CARDIAC) 20 MG/ML IV SOLN
INTRAVENOUS | Status: DC | PRN
  Administered 2018-01-17: 80 mg via INTRAVENOUS

## 2018-01-17 MED ORDER — GLIPIZIDE 5 MG PO TABS
5.0000 mg | ORAL_TABLET | Freq: Two times a day (BID) | ORAL | Status: DC
  Administered 2018-01-17 – 2018-01-18 (×2): 5 mg via ORAL
  Filled 2018-01-17 (×2): qty 1

## 2018-01-17 MED ORDER — LACTATED RINGERS IV SOLN
INTRAVENOUS | Status: DC
  Administered 2018-01-17: 16:00:00 via INTRAVENOUS

## 2018-01-17 MED ORDER — CEFOXITIN SODIUM-DEXTROSE 2-2.2 GM-%(50ML) IV SOLR
INTRAVENOUS | Status: AC
  Filled 2018-01-17: qty 50

## 2018-01-17 MED ORDER — FAMOTIDINE 20 MG PO TABS
ORAL_TABLET | ORAL | Status: AC
  Administered 2018-01-17: 20 mg via ORAL
  Filled 2018-01-17: qty 1

## 2018-01-17 MED ORDER — ACETAMINOPHEN NICU IV SYRINGE 10 MG/ML
INTRAVENOUS | Status: AC
  Filled 2018-01-17: qty 1

## 2018-01-17 MED ORDER — PROPOFOL 10 MG/ML IV BOLUS
INTRAVENOUS | Status: DC | PRN
  Administered 2018-01-17: 150 mg via INTRAVENOUS

## 2018-01-17 MED ORDER — ACETAMINOPHEN 10 MG/ML IV SOLN
INTRAVENOUS | Status: DC | PRN
  Administered 2018-01-17: 1000 mg via INTRAVENOUS

## 2018-01-17 MED ORDER — PROPOFOL 10 MG/ML IV BOLUS
INTRAVENOUS | Status: AC
  Filled 2018-01-17: qty 20

## 2018-01-17 MED ORDER — DEXAMETHASONE SODIUM PHOSPHATE 10 MG/ML IJ SOLN
INTRAMUSCULAR | Status: DC | PRN
  Administered 2018-01-17: 6 mg via INTRAVENOUS

## 2018-01-17 MED ORDER — CEFOXITIN SODIUM-DEXTROSE 2-2.2 GM-%(50ML) IV SOLR
2.0000 g | INTRAVENOUS | Status: AC
  Administered 2018-01-17: 2 g via INTRAVENOUS

## 2018-01-17 MED ORDER — OXYCODONE HCL 5 MG PO TABS
5.0000 mg | ORAL_TABLET | Freq: Once | ORAL | Status: AC | PRN
  Administered 2018-01-17: 5 mg via ORAL

## 2018-01-17 MED ORDER — OXYCODONE HCL 5 MG PO TABS
5.0000 mg | ORAL_TABLET | Freq: Once | ORAL | Status: AC
  Administered 2018-01-17: 5 mg via ORAL

## 2018-01-17 MED ORDER — ONDANSETRON HCL 4 MG/2ML IJ SOLN
INTRAMUSCULAR | Status: AC
  Filled 2018-01-17: qty 2

## 2018-01-17 MED ORDER — MORPHINE SULFATE (PF) 2 MG/ML IV SOLN
1.0000 mg | INTRAVENOUS | Status: DC | PRN
  Administered 2018-01-17 (×2): 2 mg via INTRAVENOUS
  Filled 2018-01-17 (×2): qty 1

## 2018-01-17 MED ORDER — OXYCODONE HCL 5 MG PO TABS
ORAL_TABLET | ORAL | Status: AC
Start: 2018-01-17 — End: 2018-01-17
  Administered 2018-01-17: 5 mg via ORAL
  Filled 2018-01-17: qty 1

## 2018-01-17 MED ORDER — MIDAZOLAM HCL 2 MG/2ML IJ SOLN
INTRAMUSCULAR | Status: AC
  Filled 2018-01-17: qty 2

## 2018-01-17 MED ORDER — KETOROLAC TROMETHAMINE 30 MG/ML IJ SOLN
30.0000 mg | Freq: Four times a day (QID) | INTRAMUSCULAR | Status: DC
  Administered 2018-01-17 – 2018-01-18 (×3): 30 mg via INTRAVENOUS
  Filled 2018-01-17 (×3): qty 1

## 2018-01-17 MED ORDER — SENNOSIDES-DOCUSATE SODIUM 8.6-50 MG PO TABS: 1.0000 | ORAL_TABLET | Freq: Every evening | ORAL | Status: DC | PRN

## 2018-01-17 MED ORDER — ONDANSETRON HCL 4 MG/2ML IJ SOLN: 4.0000 mg | Freq: Once | INTRAMUSCULAR | Status: DC | PRN

## 2018-01-17 MED ORDER — KETOROLAC TROMETHAMINE 30 MG/ML IJ SOLN: 30.0000 mg | Freq: Four times a day (QID) | INTRAMUSCULAR | Status: DC

## 2018-01-17 MED ORDER — BUSPIRONE HCL 15 MG PO TABS
15.0000 mg | ORAL_TABLET | Freq: Every evening | ORAL | Status: DC
  Administered 2018-01-17: 15 mg via ORAL
  Filled 2018-01-17: qty 1

## 2018-01-17 MED ORDER — OXYCODONE HCL 5 MG/5ML PO SOLN: 5.0000 mg | Freq: Once | ORAL | Status: AC | PRN

## 2018-01-17 MED ORDER — ONDANSETRON HCL 4 MG PO TABS: 4.0000 mg | ORAL_TABLET | Freq: Four times a day (QID) | ORAL | Status: DC | PRN

## 2018-01-17 MED ORDER — FENTANYL CITRATE (PF) 100 MCG/2ML IJ SOLN
INTRAMUSCULAR | Status: AC
  Filled 2018-01-17: qty 2

## 2018-01-17 MED ORDER — OXYCODONE-ACETAMINOPHEN 7.5-325 MG PO TABS: 1.0000 | ORAL_TABLET | ORAL | 0 refills | Status: AC | PRN

## 2018-01-17 MED ORDER — ALBUTEROL SULFATE (2.5 MG/3ML) 0.083% IN NEBU
3.0000 mL | INHALATION_SOLUTION | Freq: Four times a day (QID) | RESPIRATORY_TRACT | Status: DC | PRN
  Filled 2018-01-17: qty 3

## 2018-01-17 MED ORDER — SIMETHICONE 80 MG PO CHEW: 80.0000 mg | CHEWABLE_TABLET | Freq: Four times a day (QID) | ORAL | Status: DC | PRN

## 2018-01-17 MED ORDER — LACTATED RINGERS IV SOLN: INTRAVENOUS | Status: DC

## 2018-01-17 MED ORDER — ONDANSETRON HCL 4 MG/2ML IJ SOLN: 4.0000 mg | Freq: Four times a day (QID) | INTRAMUSCULAR | Status: DC | PRN

## 2018-01-17 MED ORDER — METFORMIN HCL ER 500 MG PO TB24
500.0000 mg | ORAL_TABLET | Freq: Every day | ORAL | Status: DC
  Administered 2018-01-17: 500 mg via ORAL
  Filled 2018-01-17: qty 1

## 2018-01-17 MED ORDER — DEXAMETHASONE SODIUM PHOSPHATE 10 MG/ML IJ SOLN
INTRAMUSCULAR | Status: AC
  Filled 2018-01-17: qty 1

## 2018-01-17 MED ORDER — MONTELUKAST SODIUM 10 MG PO TABS
10.0000 mg | ORAL_TABLET | Freq: Every day | ORAL | Status: DC
  Administered 2018-01-17: 10 mg via ORAL
  Filled 2018-01-17: qty 1

## 2018-01-17 MED ORDER — KETOROLAC TROMETHAMINE 30 MG/ML IJ SOLN
INTRAMUSCULAR | Status: DC | PRN
  Administered 2018-01-17: 30 mg via INTRAVENOUS

## 2018-01-17 MED ORDER — SUGAMMADEX SODIUM 200 MG/2ML IV SOLN
INTRAVENOUS | Status: DC | PRN
  Administered 2018-01-17: 200 mg via INTRAVENOUS

## 2018-01-17 MED ORDER — FAMOTIDINE 20 MG PO TABS
20.0000 mg | ORAL_TABLET | Freq: Once | ORAL | Status: AC
  Administered 2018-01-17: 20 mg via ORAL

## 2018-01-17 MED ORDER — OXYCODONE-ACETAMINOPHEN 7.5-325 MG PO TABS
2.0000 | ORAL_TABLET | ORAL | Status: DC | PRN
  Administered 2018-01-17 – 2018-01-18 (×3): 2 via ORAL
  Filled 2018-01-17 (×4): qty 2

## 2018-01-17 MED ORDER — ACETAMINOPHEN 650 MG RE SUPP: 650.0000 mg | RECTAL | Status: DC | PRN

## 2018-01-17 MED ORDER — ROCURONIUM BROMIDE 100 MG/10ML IV SOLN
INTRAVENOUS | Status: DC | PRN
  Administered 2018-01-17: 50 mg via INTRAVENOUS

## 2018-01-17 MED ORDER — DOCUSATE SODIUM 100 MG PO CAPS
100.0000 mg | ORAL_CAPSULE | Freq: Two times a day (BID) | ORAL | Status: DC
  Administered 2018-01-17 – 2018-01-18 (×2): 100 mg via ORAL
  Filled 2018-01-17 (×2): qty 1

## 2018-01-17 MED ORDER — SUCCINYLCHOLINE CHLORIDE 20 MG/ML IJ SOLN
INTRAMUSCULAR | Status: DC | PRN
  Administered 2018-01-17: 100 mg via INTRAVENOUS

## 2018-01-17 MED ORDER — FENTANYL CITRATE (PF) 100 MCG/2ML IJ SOLN
INTRAMUSCULAR | Status: DC | PRN
  Administered 2018-01-17: 50 ug via INTRAVENOUS
  Administered 2018-01-17: 100 ug via INTRAVENOUS

## 2018-01-17 MED ORDER — OXYCODONE HCL 5 MG PO TABS
ORAL_TABLET | ORAL | Status: AC
  Filled 2018-01-17: qty 1

## 2018-01-17 MED ORDER — BISACODYL 10 MG RE SUPP: 10.0000 mg | Freq: Every day | RECTAL | Status: DC | PRN

## 2018-01-17 MED ORDER — MIDAZOLAM HCL 2 MG/2ML IJ SOLN
INTRAMUSCULAR | Status: DC | PRN
  Administered 2018-01-17: 2 mg via INTRAVENOUS

## 2018-01-17 MED ORDER — MIDAZOLAM HCL 2 MG/2ML IJ SOLN
INTRAMUSCULAR | Status: AC
Start: 2018-01-17 — End: 2018-01-17
  Filled 2018-01-17: qty 2

## 2018-01-17 MED ORDER — FENTANYL CITRATE (PF) 100 MCG/2ML IJ SOLN
INTRAMUSCULAR | Status: AC
  Administered 2018-01-17: 25 ug via INTRAVENOUS
  Filled 2018-01-17: qty 2

## 2018-01-17 MED ORDER — BUPIVACAINE HCL (PF) 0.5 % IJ SOLN
INTRAMUSCULAR | Status: DC | PRN
  Administered 2018-01-17: 3 mL
  Administered 2018-01-17: 10 mL

## 2018-01-17 MED ORDER — ACETAMINOPHEN 500 MG PO TABS
500.0000 mg | ORAL_TABLET | Freq: Once | ORAL | Status: AC
  Administered 2018-01-17: 1000 mg via ORAL

## 2018-01-17 MED ORDER — MORPHINE SULFATE (PF) 2 MG/ML IV SOLN: 1.0000 mg | INTRAVENOUS | Status: DC | PRN

## 2018-01-17 MED ORDER — SODIUM CHLORIDE 0.9 % IV SOLN
INTRAVENOUS | Status: DC
  Administered 2018-01-17: 10:00:00 via INTRAVENOUS

## 2018-01-17 MED ORDER — FENTANYL CITRATE (PF) 100 MCG/2ML IJ SOLN
25.0000 ug | INTRAMUSCULAR | Status: DC | PRN
  Administered 2018-01-17 (×4): 25 ug via INTRAVENOUS

## 2018-01-17 MED ORDER — LISINOPRIL 10 MG PO TABS
10.0000 mg | ORAL_TABLET | Freq: Every day | ORAL | Status: DC
  Administered 2018-01-17: 10 mg via ORAL
  Filled 2018-01-17: qty 1

## 2018-01-17 MED ORDER — ACETAMINOPHEN 500 MG PO TABS
ORAL_TABLET | ORAL | Status: AC
  Administered 2018-01-17: 1000 mg via ORAL
  Filled 2018-01-17: qty 2

## 2018-01-17 SURGICAL SUPPLY — 53 items
ADH SKN CLS APL DERMABOND .7 (GAUZE/BANDAGES/DRESSINGS) ×2
BAG URINE DRAINAGE (UROLOGICAL SUPPLIES) ×3 IMPLANT
BLADE SURG SZ11 CARB STEEL (BLADE) ×3 IMPLANT
CANISTER SUCT 1200ML W/VALVE (MISCELLANEOUS) ×3 IMPLANT
CATH FOLEY 2WAY  5CC 16FR (CATHETERS) ×1
CATH FOLEY 2WAY 5CC 16FR (CATHETERS) ×2
CATH URTH 16FR FL 2W BLN LF (CATHETERS) ×2 IMPLANT
CHLORAPREP W/TINT 26ML (MISCELLANEOUS) ×3 IMPLANT
DEFOGGER SCOPE WARMER CLEARIFY (MISCELLANEOUS) ×3 IMPLANT
DERMABOND ADVANCED (GAUZE/BANDAGES/DRESSINGS) ×1
DERMABOND ADVANCED .7 DNX12 (GAUZE/BANDAGES/DRESSINGS) ×2 IMPLANT
DEVICE SUTURE ENDOST 10MM (ENDOMECHANICALS) ×4 IMPLANT
DRAPE CAMERA CLOSED 9X96 (DRAPES) ×3 IMPLANT
DRSG TEGADERM 2-3/8X2-3/4 SM (GAUZE/BANDAGES/DRESSINGS) IMPLANT
ENDOSTITCH 0 SINGLE 48 (SUTURE) ×12 IMPLANT
GLOVE BIO SURGEON STRL SZ8 (GLOVE) ×15 IMPLANT
GLOVE INDICATOR 8.0 STRL GRN (GLOVE) ×7 IMPLANT
GOWN STRL REUS W/ TWL LRG LVL3 (GOWN DISPOSABLE) ×2 IMPLANT
GOWN STRL REUS W/ TWL XL LVL3 (GOWN DISPOSABLE) ×4 IMPLANT
GOWN STRL REUS W/TWL LRG LVL3 (GOWN DISPOSABLE) ×3
GOWN STRL REUS W/TWL XL LVL3 (GOWN DISPOSABLE) ×6
GRASPER SUT TROCAR 14GX15 (MISCELLANEOUS) ×3 IMPLANT
IRRIGATION STRYKERFLOW (MISCELLANEOUS) ×2 IMPLANT
IRRIGATOR STRYKERFLOW (MISCELLANEOUS) ×3
IV LACTATED RINGERS 1000ML (IV SOLUTION) ×6 IMPLANT
KIT PINK PAD W/HEAD ARE REST (MISCELLANEOUS) ×3
KIT PINK PAD W/HEAD ARM REST (MISCELLANEOUS) ×2 IMPLANT
KIT TURNOVER CYSTO (KITS) ×3 IMPLANT
LABEL OR SOLS (LABEL) ×3 IMPLANT
MANIPULATOR VCARE LG CRV RETR (MISCELLANEOUS) ×2 IMPLANT
MANIPULATOR VCARE SML CRV RETR (MISCELLANEOUS) IMPLANT
MANIPULATOR VCARE STD CRV RETR (MISCELLANEOUS) IMPLANT
NEEDLE VERESS 14GA 120MM (NEEDLE) ×3 IMPLANT
NS IRRIG 500ML POUR BTL (IV SOLUTION) ×3 IMPLANT
OCCLUDER COLPOPNEUMO (BALLOONS) ×3 IMPLANT
PACK GYN LAPAROSCOPIC (MISCELLANEOUS) ×3 IMPLANT
PAD OB MATERNITY 4.3X12.25 (PERSONAL CARE ITEMS) ×3 IMPLANT
PAD PREP 24X41 OB/GYN DISP (PERSONAL CARE ITEMS) ×3 IMPLANT
SCISSORS METZENBAUM CVD 33 (INSTRUMENTS) ×1 IMPLANT
SET CYSTO W/LG BORE CLAMP LF (SET/KITS/TRAYS/PACK) ×3 IMPLANT
SHEARS HARMONIC ACE PLUS 36CM (ENDOMECHANICALS) ×3 IMPLANT
SLEEVE ENDOPATH XCEL 5M (ENDOMECHANICALS) ×3 IMPLANT
SPONGE GAUZE 2X2 8PLY STRL LF (GAUZE/BANDAGES/DRESSINGS) IMPLANT
SUT ENDO VLOC 180-0-8IN (SUTURE) IMPLANT
SUT VIC AB 0 CT1 36 (SUTURE) ×3 IMPLANT
SUT VIC AB 4-0 FS2 27 (SUTURE) ×3 IMPLANT
SYR 10ML LL (SYRINGE) ×3 IMPLANT
SYR 50ML LL SCALE MARK (SYRINGE) ×3 IMPLANT
TROCAR 12M 150ML BLUNT (TROCAR) ×1 IMPLANT
TROCAR 5M 150ML BLDLS (TROCAR) ×2 IMPLANT
TROCAR ENDO BLADELESS 11MM (ENDOMECHANICALS) ×3 IMPLANT
TROCAR XCEL NON-BLD 5MMX100MML (ENDOMECHANICALS) ×1 IMPLANT
TUBING INSUF HEATED (TUBING) ×3 IMPLANT

## 2018-01-17 NOTE — Anesthesia Preprocedure Evaluation (Signed)
Anesthesia Evaluation  Patient identified by MRN, date of birth, ID band Patient awake    Reviewed: Allergy & Precautions, H&P , NPO status , Patient's Chart, lab work & pertinent test results, reviewed documented beta blocker date and time   History of Anesthesia Complications (+) Family history of anesthesia reaction and history of anesthetic complications  Airway Mallampati: III  TM Distance: >3 FB Neck ROM: full    Dental  (+) Teeth Intact   Pulmonary neg pulmonary ROS, neg shortness of breath, pneumonia, resolved, former smoker,    Pulmonary exam normal        Cardiovascular Exercise Tolerance: Poor hypertension, On Medications negative cardio ROS Normal cardiovascular exam Rhythm:regular Rate:Normal     Neuro/Psych  Headaches, PSYCHIATRIC DISORDERS Anxiety Depression Schizophrenia negative neurological ROS  negative psych ROS   GI/Hepatic negative GI ROS, Neg liver ROS, GERD  Medicated,  Endo/Other  negative endocrine ROSdiabetesMorbid obesity  Renal/GU negative Renal ROS  negative genitourinary   Musculoskeletal   Abdominal   Peds  Hematology negative hematology ROS (+)   Anesthesia Other Findings Past Medical History: No date: Complication of anesthesia     Comment:  WOKE UP DURING SURGERY FOR BOTH SURGERIES No date: Depression No date: Diabetes mellitus, type II (HCC) No date: Family history of adverse reaction to anesthesia     Comment:  MOM-HARD TO WAKE UP No date: Fatigue No date: GERD (gastroesophageal reflux disease) No date: Headache 07/16/2015: Hypertension No date: Pneumonia     Comment:  BEGINNING OF FEB 2019-RESOLVED-PT STATES SHE GETS               PNEUMONIA YEARLY No date: Psychosis (HCC) No date: PTSD (post-traumatic stress disorder) Past Surgical History: No date: BREAST REDUCTION SURGERY No date: NOVASURE ABLATION BMI    Body Mass Index:  55.52 kg/m     Reproductive/Obstetrics negative OB ROS                             Anesthesia Physical Anesthesia Plan  ASA: III  Anesthesia Plan: General ETT   Post-op Pain Management:    Induction:   PONV Risk Score and Plan: 4 or greater  Airway Management Planned:   Additional Equipment:   Intra-op Plan:   Post-operative Plan:   Informed Consent: I have reviewed the patients History and Physical, chart, labs and discussed the procedure including the risks, benefits and alternatives for the proposed anesthesia with the patient or authorized representative who has indicated his/her understanding and acceptance.   Dental Advisory Given  Plan Discussed with: CRNA  Anesthesia Plan Comments:         Anesthesia Quick Evaluation

## 2018-01-17 NOTE — Anesthesia Post-op Follow-up Note (Signed)
Anesthesia QCDR form completed.        

## 2018-01-17 NOTE — Discharge Instructions (Signed)
Total Laparoscopic Hysterectomy, Care After °Refer to this sheet in the next few weeks. These instructions provide you with information on caring for yourself after your procedure. Your health care provider may also give you more specific instructions. Your treatment has been planned according to current medical practices, but problems sometimes occur. Call your health care provider if you have any problems or questions after your procedure. °What can I expect after the procedure? °· Pain and bruising at the incision sites. You will be given pain medicine to control it. °· Menopausal symptoms such as hot flashes, night sweats, and insomnia if your ovaries were removed. °· Sore throat from the breathing tube that was inserted during surgery. °Follow these instructions at home: °· Only take over-the-counter or prescription medicines for pain, discomfort, or fever as directed by your health care provider. °· Do not take aspirin. It can cause bleeding. °· Do not drive when taking pain medicine. °· Follow your health care provider's advice regarding diet, exercise, lifting, driving, and general activities. °· Resume your usual diet as directed and allowed. °· Get plenty of rest and sleep. °· Do not douche, use tampons, or have sexual intercourse for at least 6 weeks, or until your health care provider gives you permission. °· Change your bandages (dressings) as directed by your health care provider. °· Monitor your temperature and notify your health care provider of a fever. °· Take showers instead of baths for 2-3 weeks. °· Do not drink alcohol until your health care provider gives you permission. °· If you develop constipation, you may take a mild laxative with your health care provider's permission. Bran foods may help with constipation problems. Drinking enough fluids to keep your urine clear or pale yellow may help as well. °· Try to have someone home with you for 1-2 weeks to help around the house. °· Keep all of  your follow-up appointments as directed by your health care provider. °Contact a health care provider if: °· You have swelling, redness, or increasing pain around your incision sites. °· You have pus coming from your incision. °· You notice a bad smell coming from your incision. °· Your incision breaks open. °· You feel dizzy or lightheaded. °· You have pain or bleeding when you urinate. °· You have persistent diarrhea. °· You have persistent nausea and vomiting. °· You have abnormal vaginal discharge. °· You have a rash. °· You have any type of abnormal reaction or develop an allergy to your medicine. °· You have poor pain control with your prescribed medicine. °Get help right away if: °· You have chest pain or shortness of breath. °· You have severe abdominal pain that is not relieved with pain medicine. °· You have pain or swelling in your legs. °This information is not intended to replace advice given to you by your health care provider. Make sure you discuss any questions you have with your health care provider. °Document Released: 08/15/2013 Document Revised: 04/01/2016 Document Reviewed: 05/15/2013 °Elsevier Interactive Patient Education © 2017 [employer] ° °AMBULATORY SURGERY  °DISCHARGE INSTRUCTIONS ° ° °1) The drugs that you were given will stay in your system until tomorrow so for the next 24 hours you should not: ° °A) Drive an automobile °B) Make any legal decisions °C) Drink any alcoholic beverage ° ° °2) You may resume regular meals tomorrow.  Today it is better to start with liquids and gradually work up to solid foods. ° °You may eat anything you prefer, but it is better   to start with liquids, then soup and crackers, and gradually work up to solid foods. ° ° °3) Please notify your doctor immediately if you have any unusual bleeding, trouble breathing, redness and pain at the surgery site, drainage, fever, or pain not relieved by medication. ° ° ° °4) Additional Instructions: ° ° ° ° ° ° ° °Please  contact your physician with any problems or Same Day Surgery at [phone], Monday through Friday 6 am to 4 pm, or [place] [facility] [place] Main number at [phone]. ° °

## 2018-01-17 NOTE — Anesthesia Procedure Notes (Signed)
Procedure Name: Intubation Date/Time: 01/17/2018 11:28 AM Performed by: Danelle BerryWarr, Tinleigh Whitmire E, CRNA Pre-anesthesia Checklist: Patient identified, Emergency Drugs available, Suction available, Patient being monitored and Timeout performed Patient Re-evaluated:Patient Re-evaluated prior to induction Oxygen Delivery Method: Circle system utilized and Simple face mask Preoxygenation: Pre-oxygenation with 100% oxygen Induction Type: IV induction Ventilation: Mask ventilation without difficulty Laryngoscope Size: McGraph and 3 Grade View: Grade II Tube type: Oral Tube size: 7.0 mm Number of attempts: 1 Airway Equipment and Method: Stylet

## 2018-01-17 NOTE — Progress Notes (Signed)
Day of Surgery Procedure(s) (LRB): HYSTERECTOMY TOTAL LAPAROSCOPIC WITH BILATERAL SALPINGECTOMY (Bilateral) CYSTOSCOPY  Subjective: Patient reports incisional pain and lower pelvic pain; feels she cannot maneuver stairs and pain control at home yet.    Objective: I have reviewed patient's vital signs, intake and output and medications.  Abd: Min T, ND Incision: clean, dry and intact Extr: no calf T, no edema  Assessment: s/p Procedure(s): HYSTERECTOMY TOTAL LAPAROSCOPIC WITH BILATERAL SALPINGECTOMY (Bilateral) CYSTOSCOPY: stable and with pain requiring overnight stay  Plan: Advance diet Encourage ambulation Advance to PO medication Monitor pain overnight, meds  LOS: 0 days    Letitia LibraRobert Paul Alee Gressman 01/17/2018, 3:40 PM

## 2018-01-17 NOTE — Op Note (Signed)
Operative Report:  PRE-OP DIAGNOSIS: ADENOMYOSIS,IRREGULAR MENSES,PELVIC PAIN,FAMILY HISTORY OF OVARIAN CANCER   POST-OP DIAGNOSIS: ADENOMYOSIS,IRREGULAR MENSES,PELVIC PAIN,FAMILY HISTORY OF OVARIAN CANCER   PROCEDURE: Procedure(s): HYSTERECTOMY TOTAL LAPAROSCOPIC WITH BILATERAL SALPINGECTOMY CYSTOSCOPY  SURGEON: Barnett Applebaum, MD, FACOG  ASSISTANT: Dr Georgianne Fick   ANESTHESIA: General endotracheal anesthesia  ESTIMATED BLOOD LOSS: 100 mL  SPECIMENS: Uterus, Tubes.  COMPLICATIONS: None  DISPOSITION: stable to PACU  FINDINGS: Intraabdominal adhesions were noted. Adhesions in loert posterior uterus and with omentum to left adnexa.  Appendix normal. Ovaries normal.  Preop testing for BRCA gene panel negative (thus ovarian preservation).  PROCEDURE:  The patient was taken to the OR where anesthesia was administed. She was prepped and draped in the normal sterile fashion in the dorsal lithotomy position in the [place] stirrups. A time out was performed. A Graves speculum was inserted, the cervix was grasped with a single tooth tenaculum and the endometrial cavity was sounded. The cervix was progressively dilated to a size 18 [place] with [employer] dilators. A V-Care uterine manipulator was inserted in the usual fashion without incident. Gloves were changed and attention was turned to the abdomen.   An infraumbilical transverse 3m skin incision was made with the scalpel after local anesthesia applied to the skin. A Veress-step needle was inserted in the usual fashion and confirmed using the hanging drop technique. A pneumoperitoneum was obtained by insufflation of CO2 (opening pressure of 432mg) to 1541m. A diagnostic laparoscopy was performed yielding the previously described findings. Attention was turned to the left lower quadrant where after visualization of the inferior epigastric vessels a 5mm47min incision was made with the scalpel. A 5 mm laparoscopic port was inserted. The same procedure was  repeated in the right lower quadrant with a 11mm31mcar. Attention was turned to the left aspect of the uterus, where after visualization of the ureter, the round ligament was coagulated and transected using the 5mm H42monic Scapel. The anterior and posterior leafs of the broad ligament were dissected off as the anterior one was coagulated and transected in a caudal direction towards the cuff of the uterine manipulator.  Attention was then turned to the left fallopian tube which was recognized by visualization of the fimbria. The tube is excised to its attachment to the uterus. The uterine-ovarian ligament and its blood vessels were carefully coagulated and transected using the Harmonic scapel.  Attention was turned to the right aspect of the uterus where the same procedure was performed.  The vesicouterine reflection of the peritoneum was dissected with the harmonic scapel and the bladder flap was created bluntly.  The uterine vessels were coagulated and transected bilaterally using first bipolar cautery and then the harmonic scapel. A 360 degree, circumferential colpotomy was done to completely amputate the uterus with cervix and tubes. Once the specimen was amputated it was delivered through the vagina.  An additional 5 mm suprapubic trocar was placed to assist in bowel retraction during the next step for better visualization.   The colpotomy was repaired in a interrupted fashion using a delayed absorbable suture with an endo-stitch device.  Vaginal exam confirms complete closure.  The cavity was copiously irrigated. A survey of the pelvic cavity revealed adequate hemostasis and no injury to bowel, bladder, or ureter.   A diagnostic cystoscopy was performed using saline distension of bladder with no lesions or injuries noted.  Bilateral urine flow from each ureteral orifice is visualized.  At this point the procedure was finalized. Right lower quadrant fascia incision is closed with a  vicryl suture using  the fascia closure device. All the instruments were removed from the patient's body. Gas was expelled and patient is leveled.  Incisions are closed with skin adhesive.    Patient goes to recovery room in stable condition.  All sponge, instrument, and needle counts are correct x2.     Barnett Applebaum, MD, [facility], [facility] 01/17/2018  1:08 PM

## 2018-01-17 NOTE — Transfer of Care (Signed)
Immediate Anesthesia Transfer of Care Note  Patient: Rebekah MurdersDoris J Madej  Procedure(s) Performed: HYSTERECTOMY TOTAL LAPAROSCOPIC WITH BILATERAL SALPINGECTOMY (Bilateral ) CYSTOSCOPY  Patient Location: PACU  Anesthesia Type:General  Level of Consciousness: awake and oriented  Airway & Oxygen Therapy: Patient Spontanous Breathing and Patient connected to face mask oxygen  Post-op Assessment: Report given to RN  Post vital signs: Reviewed and stable  Last Vitals:  Vitals:   01/17/18 0947  BP: (!) 155/100  Pulse: (!) 103  Resp: 17  Temp: (!) 36.3 C  SpO2: 100%    Last Pain:  Vitals:   01/17/18 0947  TempSrc: Temporal  PainSc: 5          Complications: No apparent anesthesia complications

## 2018-01-17 NOTE — H&P (Signed)
History and Physical Interval Note:  01/17/2018 1:40 PM  Rebekah Jacobs  has presented today for surgery, with the diagnosis of ADENOMYOSIS,IRREGULAR MENSES,PELVIC PAIN,FAMILY HISTORY OF OVARIAN CANCER  The various methods of treatment have been discussed with the patient and family. After consideration of risks, benefits and other options for treatment, the patient has consented to  Procedure(s): HYSTERECTOMY TOTAL LAPAROSCOPIC WITH BILATERAL SALPINGECTOMY (Bilateral) CYSTOSCOPY as a surgical intervention .  The patient's history has been reviewed, patient examined, no change in status, stable for surgery.  Pt has the following beta blocker history-  Not taking Beta Blocker.  I have reviewed the patient's chart and labs.  Questions were answered to the patient's satisfaction.    Annamarie MajorPaul Cataleyah Colborn, MD, Merlinda FrederickFACOG [facility], [facility] 01/17/2018  1:40 PM

## 2018-01-18 ENCOUNTER — Encounter: Payer: Self-pay | Admitting: Obstetrics & Gynecology

## 2018-01-18 ENCOUNTER — Other Ambulatory Visit (HOSPITAL_COMMUNITY): Payer: Self-pay | Admitting: Obstetrics and Gynecology

## 2018-01-18 DIAGNOSIS — R042 Hemoptysis: Secondary | ICD-10-CM

## 2018-01-18 DIAGNOSIS — N800 Endometriosis of uterus: Secondary | ICD-10-CM | POA: Diagnosis not present

## 2018-01-18 LAB — HEMOGLOBIN: Hemoglobin: 11.9 g/dL — ABNORMAL LOW (ref 12.0–16.0)

## 2018-01-18 NOTE — Discharge Summary (Signed)
Gynecology Physician Postoperative Discharge Summary  Patient ID: Rebekah Jacobs MRN: [id number] DOB/AGE: [birth year] 42 y.o.  Admit Date: 01/17/2018 Discharge Date: 01/18/2018  Preoperative Diagnoses: Pelvic pain, Adenomyosis  Procedures: Procedure(s) (LRB): HYSTERECTOMY TOTAL LAPAROSCOPIC WITH BILATERAL SALPINGECTOMY (Bilateral) CYSTOSCOPY  Significant Labs: CBC Latest Ref Rng & Units 01/18/2018 01/12/2018 02/09/2015  WBC 3.6 - 11.0 K/uL - 4.1 5.5  Hemoglobin 12.0 - 16.0 g/dL 11.9(L) 13.0 12.8  Hematocrit 35.0 - 47.0 % - 39.7 39.9  Platelets 150 - 440 K/uL - 292 315    Hospital Course:  Rebekah Jacobs is a 42 y.o. G1P1001  admitted for scheduled surgery.  She underwent the procedures as mentioned above, her operation was uncomplicated. For further details about surgery, please refer to the operative report. Patient had an uncomplicated postoperative course. By time of discharge on POD#1, her pain was controlled on oral pain medications; she was ambulating, voiding without difficulty, tolerating regular diet and passing flatus. She was deemed stable for discharge to home.   Discharge Exam: Blood pressure 115/75, pulse 94, temperature 97.6 F (36.4 C), temperature source Oral, resp. rate 18, height 5\' 6"  (1.676 m), weight (!) 344 lb (156 kg), last menstrual period 12/29/2017, SpO2 95 %. General appearance: alert and no distress  Resp: clear to auscultation bilaterally  Cardio: regular rate and rhythm  GI: soft, non-tender; bowel sounds normal; no masses, no organomegaly.  Incision: C/D/I, no erythema, no drainage noted Pelvic: scant blood on pad  Extremities: extremities normal, atraumatic, no cyanosis or edema and Homans sign is negative, no sign of DVT  Discharged Condition: Stable  Disposition: 01-Home or Self Care  Discharge Instructions    Call MD for:  persistant nausea and vomiting   Complete by:  As directed    Call MD for:  redness, tenderness, or signs of infection (pain,  swelling, redness, odor or green/yellow discharge around incision site)   Complete by:  As directed    Call MD for:  severe uncontrolled pain   Complete by:  As directed    Call MD for:  temperature >100.4   Complete by:  As directed    Change dressing (specify)   Complete by:  As directed    Dressing change: remove any dressings tomorrow   Diet general   Complete by:  As directed    Discharge instructions   Complete by:  As directed    Resume activities according to discharge instruction sheets   Increase activity slowly   Complete by:  As directed      Allergies as of 01/18/2018      Reactions   Shellfish Allergy Anaphylaxis   Peanuts [peanut Oil] Swelling      Medication List    TAKE these medications   albuterol 108 (90 Base) MCG/ACT inhaler Commonly known as:  PROVENTIL HFA;VENTOLIN HFA Inhale 2 puffs into the lungs every 6 (six) hours as needed.   busPIRone 15 MG tablet Commonly known as:  BUSPAR Take 1 tablet by mouth every evening.   gabapentin 300 MG capsule Commonly known as:  NEURONTIN Take 300 mg by mouth 3 (three) times daily as needed.   glipiZIDE 5 MG tablet Commonly known as:  GLUCOTROL Take 5 mg by mouth 2 (two) times daily before a meal.   ibuprofen 800 MG tablet Commonly known as:  ADVIL,MOTRIN Take 1 tablet (800 mg total) by mouth every 8 (eight) hours as needed for moderate pain.   lisinopril 10 MG tablet Commonly known as:  PRINIVIL,ZESTRIL  Take 10 mg by mouth at bedtime.   lovastatin 20 MG tablet Commonly known as:  MEVACOR Take 20 mg by mouth at bedtime.   metFORMIN 500 MG 24 hr tablet Commonly known as:  GLUCOPHAGE-XR Take 500 mg by mouth at bedtime. At bedtime   montelukast 10 MG tablet Commonly known as:  SINGULAIR Take 10 mg by mouth at bedtime.   naproxen 500 MG tablet Commonly known as:  NAPROSYN Take 500 mg by mouth 2 (two) times daily as needed for mild pain.   oxyCODONE-acetaminophen 7.5-325 MG tablet Commonly known  as:  PERCOCET Take 1 tablet by mouth every 4 (four) hours as needed for severe pain.   risperiDONE 1 MG tablet Commonly known as:  RISPERDAL Take 1 tablet (1 mg total) by mouth at bedtime. Take half a tablet at bedtime What changed:    how much to take  additional instructions            Discharge Care Instructions  (From admission, onward)        Start     Ordered   01/18/18 0000  Change dressing (specify)    Comments:  Dressing change: remove any dressings tomorrow   01/18/18 0655     Follow-up Information    Nadara Mustard, MD. Go on 01/31/2018.   Specialty:  Obstetrics and Gynecology Why:  10 am Contact information: [address] [place] [address] [phone]           Annamarie Major, MD

## 2018-01-18 NOTE — Progress Notes (Signed)
Patient discharged home with family. Discharge instructions, prescriptions and follow up appointment given to and reviewed with patient and family. Patient verbalized understanding. Escorted out via wheelchair by auxiliary.  

## 2018-01-19 LAB — SURGICAL PATHOLOGY

## 2018-01-24 NOTE — Anesthesia Postprocedure Evaluation (Signed)
Anesthesia Post Note  Patient: Rebekah Jacobs  Procedure(s) Performed: HYSTERECTOMY TOTAL LAPAROSCOPIC WITH BILATERAL SALPINGECTOMY (Bilateral ) CYSTOSCOPY  Patient location during evaluation: PACU Anesthesia Type: General Level of consciousness: awake and alert Pain management: pain level controlled Vital Signs Assessment: post-procedure vital signs reviewed and stable Respiratory status: spontaneous breathing, nonlabored ventilation, respiratory function stable and patient connected to nasal cannula oxygen Cardiovascular status: blood pressure returned to baseline and stable Postop Assessment: no apparent nausea or vomiting Anesthetic complications: no     Last Vitals:  Vitals:   01/18/18 0410 01/18/18 0815  BP: 115/75 119/69  Pulse: 94 88  Resp: 18 18  Temp: 36.4 C 36.7 C  SpO2: 95% 96%    Last Pain:  Vitals:   01/18/18 0815  TempSrc: Oral  PainSc:                  Yevette EdwardsJames G Adams

## 2018-01-31 ENCOUNTER — Encounter: Payer: Self-pay | Admitting: Obstetrics & Gynecology

## 2018-01-31 ENCOUNTER — Ambulatory Visit (INDEPENDENT_AMBULATORY_CARE_PROVIDER_SITE_OTHER): Payer: Medicare Other | Admitting: Obstetrics & Gynecology

## 2018-01-31 VITALS — BP 140/90 | HR 96 | Ht 66.0 in | Wt 343.0 lb

## 2018-01-31 DIAGNOSIS — N8003 Adenomyosis of the uterus: Secondary | ICD-10-CM

## 2018-01-31 DIAGNOSIS — N800 Endometriosis of uterus: Secondary | ICD-10-CM

## 2018-01-31 DIAGNOSIS — N809 Endometriosis, unspecified: Principal | ICD-10-CM

## 2018-01-31 DIAGNOSIS — R102 Pelvic and perineal pain: Secondary | ICD-10-CM

## 2018-01-31 NOTE — Progress Notes (Signed)
  Postoperative Follow-up Patient presents post [facility] for abnormal uterine bleeding and pelvic pain, 2 weeks ago. Imaging:   Pathology: DIAGNOSIS:  A. UTERUS WITH CERVIX AND BILATERAL FALLOPIAN TUBES; HYSTERECTOMY WITH  BILATERAL SALPINGECTOMY:  - CERVIX WITHIN NORMAL LIMITS.  - PROLIFERATIVE ENDOMETRIUM.  - FOCAL CHANGES COMPATIBLE WITH ADENOMYOSIS.  - SEROSAL ADHESIONS.  - BILATERAL FALLOPIAN TUBES WITH VASCULAR CONGESTION.  Subjective: Patient reports marked improvement in her preop symptoms. Eating a regular diet without difficulty. The patient is not having any pain.  Activity: normal activities of daily living. Patient reports vaginal sx's of None  Objective: BP 140/90   Pulse 96   Ht 5\' 6"  (1.676 m)   Wt (!) 343 lb (155.6 kg)   BMI 55.36 kg/m  Physical Exam  Constitutional: She is oriented to person, place, and time. She appears well-developed and well-nourished. No distress.  Cardiovascular: Normal rate.  Pulmonary/Chest: Effort normal.  Abdominal: Soft. She exhibits no distension. There is no tenderness.  Incision Healing Well   Musculoskeletal: Normal range of motion.  Neurological: She is alert and oriented to person, place, and time. No cranial nerve deficit.  Skin: Skin is warm and dry.  Psychiatric: She has a normal mood and affect.    Assessment: s/p :  total laparoscopic hysterectomy with bilateral salpingectomy progressing well  Plan: Patient has done well after surgery with no apparent complications.  I have discussed the post-operative course to date, and the expected progress moving forward.  The patient understands what complications to be concerned about.  I will see the patient in routine follow up, or sooner if needed.    Activity plan: No heavy lifting. Pelvic rest  Rebekah LibraRobert Paul Moira Jacobs 01/31/2018, 10:04 AM

## 2018-03-06 ENCOUNTER — Ambulatory Visit (INDEPENDENT_AMBULATORY_CARE_PROVIDER_SITE_OTHER): Payer: Medicare Other | Admitting: Obstetrics & Gynecology

## 2018-03-06 ENCOUNTER — Encounter: Payer: Self-pay | Admitting: Obstetrics & Gynecology

## 2018-03-06 VITALS — BP 120/80 | HR 110 | Ht 66.0 in | Wt 355.0 lb

## 2018-03-06 DIAGNOSIS — R102 Pelvic and perineal pain: Secondary | ICD-10-CM

## 2018-03-06 DIAGNOSIS — Z8041 Family history of malignant neoplasm of ovary: Secondary | ICD-10-CM

## 2018-03-06 DIAGNOSIS — N926 Irregular menstruation, unspecified: Secondary | ICD-10-CM

## 2018-03-06 NOTE — Progress Notes (Signed)
  Postoperative Follow-up Patient presents post op from [facility] for abnormal uterine bleeding, pelvic pain and FH Ovarian Cancer (BRCA Neg), 6 weeks ago. Images:   Pathology: DIAGNOSIS:  A. UTERUS WITH CERVIX AND BILATERAL FALLOPIAN TUBES; HYSTERECTOMY WITH  BILATERAL SALPINGECTOMY:  - CERVIX WITHIN NORMAL LIMITS.  - PROLIFERATIVE ENDOMETRIUM.  - FOCAL CHANGES COMPATIBLE WITH ADENOMYOSIS.  - SEROSAL ADHESIONS.  - BILATERAL FALLOPIAN TUBES WITH VASCULAR CONGESTION.   Subjective: Patient reports marked improvement in her preop symptoms. Eating a regular diet without difficulty. The patient is not having any pain.  Activity: normal activities of daily living. Patient reports vaginal sx's of None  Objective: BP 120/80   Pulse (!) 110   Ht '5\' 6"'$  (1.676 m)   Wt (!) 355 lb (161 kg)   BMI 57.30 kg/m  Physical Exam  Constitutional: She is oriented to person, place, and time. She appears well-developed and well-nourished. No distress.  Genitourinary: Rectum normal and vagina normal. Pelvic exam was performed with patient supine. There is no rash, tenderness or lesion on the right labia. There is no rash, tenderness or lesion on the left labia. No erythema or bleeding in the vagina. Right adnexum does not display mass and does not display tenderness. Left adnexum does not display mass and does not display tenderness.  Genitourinary Comments: Cervix and uterus absent. Vaginal cuff healing well.  Cardiovascular: Normal rate.  Pulmonary/Chest: Effort normal.  Abdominal: Soft. She exhibits no distension. There is no tenderness.  Incision healing well.  Musculoskeletal: Normal range of motion.  Neurological: She is alert and oriented to person, place, and time. No cranial nerve deficit.  Skin: Skin is warm and dry.  Psychiatric: She has a normal mood and affect.   Assessment: s/p :  total laparoscopic hysterectomy with bilateral salpingectomy progressing well  Plan: Patient has done well  after surgery with no apparent complications.  I have discussed the post-operative course to date, and the expected progress moving forward.  The patient understands what complications to be concerned about.  I will see the patient in routine follow up, or sooner if needed.    Activity plan: No restriction. Resume exercise, all normal activities  Hoyt Koch 03/06/2018, 9:41 AM

## 2019-06-11 ENCOUNTER — Other Ambulatory Visit: Payer: Self-pay | Admitting: Family Medicine

## 2019-06-11 DIAGNOSIS — Z1231 Encounter for screening mammogram for malignant neoplasm of breast: Secondary | ICD-10-CM

## 2019-07-12 ENCOUNTER — Ambulatory Visit: Payer: Medicare Other | Admitting: Licensed Clinical Social Worker

## 2019-07-26 ENCOUNTER — Ambulatory Visit: Payer: Medicare Other | Admitting: Licensed Clinical Social Worker

## 2019-07-26 ENCOUNTER — Other Ambulatory Visit: Payer: Self-pay

## 2019-08-02 ENCOUNTER — Other Ambulatory Visit: Payer: Self-pay

## 2019-08-02 ENCOUNTER — Ambulatory Visit: Payer: Medicare Other | Admitting: Licensed Clinical Social Worker

## 2019-08-09 ENCOUNTER — Other Ambulatory Visit: Payer: Self-pay

## 2019-08-09 ENCOUNTER — Ambulatory Visit: Payer: Medicare Other | Admitting: Licensed Clinical Social Worker

## 2019-08-20 ENCOUNTER — Encounter: Payer: Self-pay | Admitting: Psychiatry

## 2019-08-20 ENCOUNTER — Ambulatory Visit (INDEPENDENT_AMBULATORY_CARE_PROVIDER_SITE_OTHER): Payer: Medicare Other | Admitting: Psychiatry

## 2019-08-20 ENCOUNTER — Other Ambulatory Visit: Payer: Self-pay

## 2019-08-20 DIAGNOSIS — F4310 Post-traumatic stress disorder, unspecified: Secondary | ICD-10-CM

## 2019-08-20 DIAGNOSIS — F3131 Bipolar disorder, current episode depressed, mild: Secondary | ICD-10-CM

## 2019-08-20 MED ORDER — VENLAFAXINE HCL ER 75 MG PO CP24: 75.0000 mg | ORAL_CAPSULE | Freq: Every day | ORAL | 1 refills | Status: DC

## 2019-08-20 MED ORDER — QUETIAPINE FUMARATE 100 MG PO TABS: 100.0000 mg | ORAL_TABLET | Freq: Every day | ORAL | 1 refills | Status: DC
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
## 2019-09-17 ENCOUNTER — Other Ambulatory Visit: Payer: Self-pay

## 2019-09-17 ENCOUNTER — Encounter: Payer: Self-pay | Admitting: Psychiatry

## 2019-09-17 ENCOUNTER — Ambulatory Visit: Payer: Medicare Other | Admitting: Psychiatry

## 2019-09-17 ENCOUNTER — Ambulatory Visit (INDEPENDENT_AMBULATORY_CARE_PROVIDER_SITE_OTHER): Payer: Medicare Other | Admitting: Psychiatry

## 2019-09-17 DIAGNOSIS — F3131 Bipolar disorder, current episode depressed, mild: Secondary | ICD-10-CM | POA: Diagnosis not present

## 2019-09-17 DIAGNOSIS — F4310 Post-traumatic stress disorder, unspecified: Secondary | ICD-10-CM | POA: Diagnosis not present

## 2019-09-17 MED ORDER — QUETIAPINE FUMARATE 100 MG PO TABS: 100.0000 mg | ORAL_TABLET | Freq: Every day | ORAL | 1 refills | Status: AC

## 2019-09-17 MED ORDER — VENLAFAXINE HCL ER 75 MG PO CP24: 75.0000 mg | ORAL_CAPSULE | Freq: Every day | ORAL | 1 refills | Status: AC

## 2019-09-17 NOTE — Progress Notes (Signed)
BH MD/PA/NP OP Progress Note  I connected with  Rebekah Jacobs on 09/17/19 by a video enabled telemedicine application and verified that I am speaking with the correct person using two identifiers.   I discussed the limitations of evaluation and management by telemedicine. The patient expressed understanding and agreed to proceed.    09/17/2019 2:59 PM Rebekah Jacobs  MRN:  [id number]  Chief Complaint:  " I am doing great."  HPI: Pt reported that she is doing great. She feels her current medications are working well for her. She denied any side effects. She reported improvement in her irritability and mood swings. She is able to well. She feels well-rested. She does not think she needs any adjustments in her current medication dosages. She reported that her family including her mom can tell a big difference.  Visit Diagnosis:    ICD-10-CM   1. Bipolar affective disorder, currently depressed, mild (HCC)  F31.31 QUEtiapine (SEROQUEL) 100 MG tablet    venlafaxine XR (EFFEXOR XR) 75 MG 24 hr capsule  2. PTSD (post-traumatic stress disorder)  F43.10 venlafaxine XR (EFFEXOR XR) 75 MG 24 hr capsule    Past Psychiatric History: Bipolar d/o, PTSD  Past Medical History:  Past Medical History:  Diagnosis Date  . Complication of anesthesia    WOKE UP DURING SURGERY FOR BOTH SURGERIES  . Depression   . Diabetes mellitus, type II (HCC)   . Family history of adverse reaction to anesthesia    MOM-HARD TO WAKE UP  . Fatigue   . GERD (gastroesophageal reflux disease)   . Headache   . Hypertension 07/16/2015  . Pneumonia    BEGINNING OF FEB 2019-RESOLVED-PT STATES SHE GETS PNEUMONIA YEARLY  . Psychosis (HCC)   . PTSD (post-traumatic stress disorder)     Past Surgical History:  Procedure Laterality Date  . BREAST REDUCTION SURGERY    . CYSTOSCOPY  01/17/2018   Procedure: CYSTOSCOPY;  Surgeon: Nadara MustardHarris, Robert P, MD;  Location: [facility];  Service: Gynecology;;  . LAPAROSCOPIC HYSTERECTOMY  Bilateral 01/17/2018   Procedure: HYSTERECTOMY TOTAL LAPAROSCOPIC WITH BILATERAL SALPINGECTOMY;  Surgeon: Nadara MustardHarris, Robert P, MD;  Location: [facility];  Service: Gynecology;  Laterality: Bilateral;  . NOVASURE ABLATION      Family Psychiatric History: see below  Family History:  Family History  Problem Relation Age of Onset  . Depression Mother   . Breast cancer Mother   . Cancer Mother   . Depression Father   . Alcohol abuse Father   . Anxiety disorder Father   . Cancer Father   . Depression Daughter   . Diabetes Paternal Grandmother     Social History:  Social History   Socioeconomic History  . Marital status: Single    Spouse name: Not on file  . Number of children: Not on file  . Years of education: Not on file  . Highest education level: Not on file  Occupational History  . Not on file  Social Needs  . Financial resource strain: Not on file  . Food insecurity    Worry: Not on file    Inability: Not on file  . Transportation needs    Medical: Not on file    Non-medical: Not on file  Tobacco Use  . Smoking status: Former Smoker    Packs/day: 0.50    Years: 5.00    Pack years: 2.50    Types: Cigarettes    Quit date: 01/11/1998    Years since quitting: 21.6  .  Smokeless tobacco: Never Used  Substance and Sexual Activity  . Alcohol use: No  . Drug use: Yes    Types: Marijuana    Comment: PT DENIES DURING 01-11-18 PHONE INTERVIEW BUT WAS ENTERED IN EPIC THAT SHE HAS USED MARIJUAN  . Sexual activity: Never    Birth control/protection: None  Lifestyle  . Physical activity    Days per week: Not on file    Minutes per session: Not on file  . Stress: Not on file  Relationships  . Social Herbalist on phone: Not on file    Gets together: Not on file    Attends religious service: Not on file    Active member of club or organization: Not on file    Attends meetings of clubs or organizations: Not on file    Relationship status: Not on file  Other Topics  Concern  . Not on file  Social History Narrative  . Not on file    Allergies:  Allergies  Allergen Reactions  . Shellfish Allergy Anaphylaxis  . Peanuts [Peanut Oil] Swelling    Metabolic Disorder Labs: No results found for: HGBA1C, MPG No results found for: PROLACTIN No results found for: CHOL, TRIG, HDL, CHOLHDL, VLDL, LDLCALC No results found for: TSH  Therapeutic Level Labs: No results found for: LITHIUM No results found for: VALPROATE No components found for:  CBMZ  Current Medications: Current Outpatient Medications  Medication Sig Dispense Refill  . albuterol (PROVENTIL HFA;VENTOLIN HFA) 108 (90 Base) MCG/ACT inhaler Inhale 2 puffs into the lungs every 6 (six) hours as needed. 1 Inhaler 0  . gabapentin (NEURONTIN) 300 MG capsule Take 300 mg by mouth 3 (three) times daily as needed.     Marland Kitchen glipiZIDE (GLUCOTROL) 5 MG tablet Take 5 mg by mouth 2 (two) times daily before a meal.    . ibuprofen (ADVIL,MOTRIN) 800 MG tablet Take 1 tablet (800 mg total) by mouth every 8 (eight) hours as needed for moderate pain. 15 tablet 0  . lisinopril (PRINIVIL,ZESTRIL) 10 MG tablet Take 10 mg by mouth at bedtime.    . lovastatin (MEVACOR) 20 MG tablet Take 20 mg by mouth at bedtime.    . metFORMIN (GLUCOPHAGE-XR) 500 MG 24 hr tablet Take 500 mg by mouth at bedtime. At bedtime    . montelukast (SINGULAIR) 10 MG tablet Take 10 mg by mouth at bedtime.    . naproxen (NAPROSYN) 500 MG tablet Take 500 mg by mouth 2 (two) times daily as needed for mild pain.     Marland Kitchen QUEtiapine (SEROQUEL) 100 MG tablet Take 1 tablet (100 mg total) by mouth at bedtime. 30 tablet 1  . venlafaxine XR (EFFEXOR XR) 75 MG 24 hr capsule Take 1 capsule (75 mg total) by mouth daily with breakfast. 30 capsule 1   No current facility-administered medications for this visit.      Musculoskeletal: Strength & Muscle Tone: unable to assess due to telemed visit Gait & Station: unable to assess due to telemed visit Patient  leans: unable to assess due to telemed visit  Psychiatric Specialty Exam: ROS  There were no vitals taken for this visit.There is no height or weight on file to calculate BMI.  General Appearance: Well Groomed  Eye Contact:  Good  Speech:  Clear and Coherent and Normal Rate  Volume:  Normal  Mood:  Euthymic  Affect:  Congruent  Thought Process:  Goal Directed, Linear and Descriptions of Associations: Intact  Orientation:  Full (  Time, Place, and Person)  Thought Content: Logical   Suicidal Thoughts:  No  Homicidal Thoughts:  No  Memory:  Recent;   Good Remote;   Good  Judgement:  Fair  Insight:  Fair  Psychomotor Activity:  Normal  Concentration:  Concentration: Good and Attention Span: Good  Recall:  Good  Fund of Knowledge: Good  Language: Good  Akathisia:  Negative  Handed:  Right  AIMS (if indicated): not done  Assets:  Communication Skills Desire for Improvement Financial Resources/Insurance Housing Social Support Talents/Skills Transportation  ADL's:  Intact  Cognition: WNL  Sleep:  Good    Assessment and Plan: 43 year old female with history of bipolar disorder and PTSD doing well on her current regimen.  1. Bipolar affective disorder, currently depressed, mild (HCC)  -Continue QUEtiapine (SEROQUEL) 100 MG tablet; Take 1 tablet (100 mg total) by mouth at bedtime.  Dispense: 30 tablet; Refill: 1 -Continue venlafaxine XR (EFFEXOR XR) 75 MG 24 hr capsule; Take 1 capsule (75 mg total) by mouth daily with breakfast.  Dispense: 30 capsule; Refill: 1  2. PTSD (post-traumatic stress disorder)  - venlafaxine XR (EFFEXOR XR) 75 MG 24 hr capsule; Take 1 capsule (75 mg total) by mouth daily with breakfast.  Dispense: 30 capsule; Refill: 1  Follow-up in 2 months.  Zena Amos, MD 09/17/2019, 2:59 PM

## 2019-11-13 ENCOUNTER — Ambulatory Visit: Payer: Medicare Other | Attending: Internal Medicine

## 2019-11-13 DIAGNOSIS — Z20822 Contact with and (suspected) exposure to covid-19: Secondary | ICD-10-CM

## 2019-11-14 ENCOUNTER — Ambulatory Visit: Payer: Medicare Other | Admitting: Psychiatry

## 2019-11-14 ENCOUNTER — Other Ambulatory Visit: Payer: Self-pay

## 2019-11-15 LAB — NOVEL CORONAVIRUS, NAA: SARS-CoV-2, NAA: NOT DETECTED

## 2020-08-21 ENCOUNTER — Other Ambulatory Visit: Payer: Self-pay | Admitting: Psychiatry

## 2020-08-21 DIAGNOSIS — F3131 Bipolar disorder, current episode depressed, mild: Secondary | ICD-10-CM

## 2020-08-22 ENCOUNTER — Other Ambulatory Visit: Payer: Self-pay | Admitting: Psychiatry

## 2020-08-22 DIAGNOSIS — F3131 Bipolar disorder, current episode depressed, mild: Secondary | ICD-10-CM

## 2020-11-25 ENCOUNTER — Other Ambulatory Visit: Payer: Self-pay | Admitting: Internal Medicine

## 2020-11-25 DIAGNOSIS — Z1231 Encounter for screening mammogram for malignant neoplasm of breast: Secondary | ICD-10-CM

## 2021-09-02 ENCOUNTER — Other Ambulatory Visit: Payer: Self-pay | Admitting: Internal Medicine

## 2021-09-02 DIAGNOSIS — Z1231 Encounter for screening mammogram for malignant neoplasm of breast: Secondary | ICD-10-CM

## 2021-09-22 ENCOUNTER — Emergency Department: Payer: Medicare Other

## 2021-09-22 ENCOUNTER — Emergency Department
Admission: EM | Admit: 2021-09-22 | Discharge: 2021-09-22 | Disposition: A | Discharge status: Home / Self Care | Payer: Medicare Other | Attending: Emergency Medicine | Admitting: Emergency Medicine

## 2021-09-22 ENCOUNTER — Other Ambulatory Visit: Payer: Self-pay

## 2021-09-22 DIAGNOSIS — Z79899 Other long term (current) drug therapy: Secondary | ICD-10-CM | POA: Diagnosis not present

## 2021-09-22 DIAGNOSIS — Z7984 Long term (current) use of oral hypoglycemic drugs: Secondary | ICD-10-CM | POA: Diagnosis not present

## 2021-09-22 DIAGNOSIS — Z87891 Personal history of nicotine dependence: Secondary | ICD-10-CM | POA: Insufficient documentation

## 2021-09-22 DIAGNOSIS — E119 Type 2 diabetes mellitus without complications: Secondary | ICD-10-CM | POA: Insufficient documentation

## 2021-09-22 DIAGNOSIS — I10 Essential (primary) hypertension: Secondary | ICD-10-CM | POA: Insufficient documentation

## 2021-09-22 DIAGNOSIS — R109 Unspecified abdominal pain: Secondary | ICD-10-CM | POA: Diagnosis not present

## 2021-09-22 DIAGNOSIS — I471 Supraventricular tachycardia: Secondary | ICD-10-CM | POA: Diagnosis not present

## 2021-09-22 DIAGNOSIS — Z91010 Allergy to peanuts: Secondary | ICD-10-CM | POA: Diagnosis not present

## 2021-09-22 DIAGNOSIS — E876 Hypokalemia: Secondary | ICD-10-CM | POA: Insufficient documentation

## 2021-09-22 DIAGNOSIS — R112 Nausea with vomiting, unspecified: Secondary | ICD-10-CM | POA: Diagnosis present

## 2021-09-22 LAB — CBC WITH DIFFERENTIAL/PLATELET
Abs Immature Granulocytes: 0.02 10*3/uL (ref 0.00–0.07)
Basophils Absolute: 0 10*3/uL (ref 0.0–0.1)
Basophils Relative: 0 %
Eosinophils Absolute: 0.1 10*3/uL (ref 0.0–0.5)
Eosinophils Relative: 1 %
HCT: 42.3 % (ref 36.0–46.0)
Hemoglobin: 13.7 g/dL (ref 12.0–15.0)
Immature Granulocytes: 0 %
Lymphocytes Relative: 33 %
Lymphs Abs: 2.4 10*3/uL (ref 0.7–4.0)
MCH: 27.9 pg (ref 26.0–34.0)
MCHC: 32.4 g/dL (ref 30.0–36.0)
MCV: 86.2 fL (ref 80.0–100.0)
Monocytes Absolute: 0.6 10*3/uL (ref 0.1–1.0)
Monocytes Relative: 8 %
Neutro Abs: 4.3 10*3/uL (ref 1.7–7.7)
Neutrophils Relative %: 58 %
Platelets: 349 10*3/uL (ref 150–400)
RBC: 4.91 MIL/uL (ref 3.87–5.11)
RDW: 13 % (ref 11.5–15.5)
WBC: 7.4 10*3/uL (ref 4.0–10.5)
nRBC: 0 % (ref 0.0–0.2)

## 2021-09-22 LAB — COMPREHENSIVE METABOLIC PANEL
ALT: 15 U/L (ref 0–44)
AST: 14 U/L — ABNORMAL LOW (ref 15–41)
Albumin: 3.4 g/dL — ABNORMAL LOW (ref 3.5–5.0)
Alkaline Phosphatase: 48 U/L (ref 38–126)
Anion gap: 9 (ref 5–15)
BUN: 13 mg/dL (ref 6–20)
CO2: 27 mmol/L (ref 22–32)
Calcium: 8.7 mg/dL — ABNORMAL LOW (ref 8.9–10.3)
Chloride: 101 mmol/L (ref 98–111)
Creatinine, Ser: 0.95 mg/dL (ref 0.44–1.00)
GFR, Estimated: >60 mL/min (ref >60)
Glucose, Bld: 103 mg/dL — ABNORMAL HIGH (ref 70–99)
Potassium: 3.1 mmol/L — ABNORMAL LOW (ref 3.5–5.1)
Sodium: 137 mmol/L (ref 135–145)
Total Bilirubin: 0.9 mg/dL (ref 0.3–1.2)
Total Protein: 6.8 g/dL (ref 6.5–8.1)

## 2021-09-22 LAB — MAGNESIUM: Magnesium: 1.6 mg/dL — ABNORMAL LOW (ref 1.7–2.4)

## 2021-09-22 MED ORDER — MAGNESIUM SULFATE 2 GM/50ML IV SOLN
2.0000 g | Freq: Once | INTRAVENOUS | Status: AC
  Administered 2021-09-22: 2 g via INTRAVENOUS
  Filled 2021-09-22: qty 50

## 2021-09-22 MED ORDER — PROMETHAZINE HCL 25 MG RE SUPP: 25.0000 mg | Freq: Four times a day (QID) | RECTAL | 0 refills | Status: AC | PRN

## 2021-09-22 MED ORDER — IOHEXOL 350 MG/ML SOLN
100.0000 mL | Freq: Once | INTRAVENOUS | Status: AC | PRN
  Administered 2021-09-22: 100 mL via INTRAVENOUS

## 2021-09-22 MED ORDER — METOPROLOL TARTRATE 25 MG PO TABS
12.5000 mg | ORAL_TABLET | Freq: Once | ORAL | Status: AC
  Administered 2021-09-22: 12.5 mg via ORAL
  Filled 2021-09-22: qty 1

## 2021-09-22 MED ORDER — POTASSIUM CHLORIDE CRYS ER 20 MEQ PO TBCR: 20.0000 meq | EXTENDED_RELEASE_TABLET | Freq: Every day | ORAL | 0 refills | Status: AC

## 2021-09-22 MED ORDER — SODIUM CHLORIDE 0.9 % IV BOLUS
1000.0000 mL | Freq: Once | INTRAVENOUS | Status: AC
  Administered 2021-09-22: 1000 mL via INTRAVENOUS

## 2021-09-22 MED ORDER — LACTATED RINGERS IV BOLUS
2000.0000 mL | Freq: Once | INTRAVENOUS | Status: AC
  Administered 2021-09-22: 2000 mL via INTRAVENOUS

## 2021-09-22 MED ORDER — ONDANSETRON HCL 4 MG/2ML IJ SOLN
4.0000 mg | Freq: Once | INTRAMUSCULAR | Status: AC
  Administered 2021-09-22: 4 mg via INTRAVENOUS
  Filled 2021-09-22: qty 2

## 2021-09-22 MED ORDER — POTASSIUM CHLORIDE CRYS ER 20 MEQ PO TBCR
40.0000 meq | EXTENDED_RELEASE_TABLET | Freq: Once | ORAL | Status: AC
  Administered 2021-09-22: 40 meq via ORAL
  Filled 2021-09-22: qty 2

## 2021-09-22 NOTE — Discharge Instructions (Addendum)
Call your Cardiologist in the AM to discuss your Er visit, see if they would like you to (a) start new medication, and (b) be seen in office  Drink at least 6-8 glasses of water daily  Take the potassium supplement for the next few days  The Zofran can be taken for nausea. If this does not work, I've given you suppositories of Phenergan that can be taken in addition to the Zofran.  OF NOTE, your CT did show diverticulosis, which is a chronic condition of the colon. Make sure you mention this to your doctor.

## 2021-09-22 NOTE — ED Provider Notes (Signed)
[facility] Emergency Department Provider Note  ____________________________________________   Event Date/Time   First MD Initiated Contact with Patient 09/22/21 1501     (approximate)  I have reviewed the triage vital signs and the nursing notes.   HISTORY  Chief Complaint SVT    HPI Rebekah Jacobs is a 45 y.o. female with past medical history of hypertension, diabetes, PTSD, here with palpitations.  The patient states that over the last several days, she has had nausea, vomiting, and weight loss.  She states her symptoms began fairly gradually as mild nausea and abdominal cramping, which has persisted since onset.  She has had associated vomiting, and states she has been essentially unable to keep anything down for the last week.  States she is lost several pounds this week.  She said some associated constipation which she feels is due to not being able to keep anything down.  Denies any fevers.  No cough or shortness of breath.  She went to her PCP yesterday and had lab work which showed hyponatremia and ketones in her urine.  She was given Zofran.  This did help her nausea.  However, today, she began to feel very lightheaded and felt like her heart was beating quickly.  This is happened intermittently but happened more severely today.  Per report, she was found to be in SVT and received 2 doses of adenosine.  She did not actually syncopized.  Denies any chest pain.  She currently feels better after the adenosine and has been in sinus tachycardia.    Past Medical History:  Diagnosis Date   Complication of anesthesia    WOKE UP DURING SURGERY FOR BOTH SURGERIES   Depression    Diabetes mellitus, type II ([place])    Family history of adverse reaction to anesthesia    MOM-HARD TO WAKE UP   Fatigue    GERD (gastroesophageal reflux disease)    Headache    Hypertension 07/16/2015   Pneumonia    BEGINNING OF FEB 2019-RESOLVED-PT STATES SHE GETS PNEUMONIA YEARLY    Psychosis ([place])    PTSD (post-traumatic stress disorder)     Patient Active Problem List   Diagnosis Date Noted   Bipolar affective disorder, currently depressed, mild ([place]) 08/20/2019   Adenomyosis 01/02/2018   Family history of breast cancer 12/22/2017   Family history of ovarian cancer 12/22/2017   Continuous auditory hallucinations 07/16/2015   PTSD (post-traumatic stress disorder) 07/16/2015   Depression, major, recurrent, severe with psychosis ([place]) 07/16/2015   GAD (generalized anxiety disorder) 07/16/2015    Past Surgical History:  Procedure Laterality Date   BREAST REDUCTION SURGERY     CYSTOSCOPY  01/17/2018   Procedure: CYSTOSCOPY;  Surgeon: Gae Dry, MD;  Location: [facility] ORS;  Service: Gynecology;;   LAPAROSCOPIC HYSTERECTOMY Bilateral 01/17/2018   Procedure: HYSTERECTOMY TOTAL LAPAROSCOPIC WITH BILATERAL SALPINGECTOMY;  Surgeon: Gae Dry, MD;  Location: [facility] ORS;  Service: Gynecology;  Laterality: Bilateral;   NOVASURE ABLATION      Prior to Admission medications   Medication Sig Start Date End Date Taking? Authorizing Provider  potassium chloride SA (KLOR-CON) 20 MEQ tablet Take 1 tablet (20 mEq total) by mouth daily for 5 days. 09/22/21 09/27/21 Yes Duffy Bruce, MD  promethazine (PHENERGAN) 25 MG suppository Place 1 suppository (25 mg total) rectally every 6 (six) hours as needed for refractory nausea / vomiting. 09/22/21  Yes Duffy Bruce, MD  albuterol (PROVENTIL HFA;VENTOLIN HFA) 108 (90 Base) MCG/ACT inhaler Inhale 2  puffs into the lungs every 6 (six) hours as needed. 12/24/15   Loney Hering, MD  gabapentin (NEURONTIN) 300 MG capsule Take 300 mg by mouth 3 (three) times daily as needed.  04/29/15   [provider]  glipiZIDE (GLUCOTROL) 5 MG tablet Take 5 mg by mouth 2 (two) times daily before a meal.    [provider]  ibuprofen (ADVIL,MOTRIN) 800 MG tablet Take 1 tablet (800 mg total) by mouth every 8 (eight) hours as  needed for moderate pain. 08/06/15   Sable Feil, PA-C  lisinopril (PRINIVIL,ZESTRIL) 10 MG tablet Take 10 mg by mouth at bedtime.    [provider]  lovastatin (MEVACOR) 20 MG tablet Take 20 mg by mouth at bedtime. 04/14/15   [provider]  metFORMIN (GLUCOPHAGE-XR) 500 MG 24 hr tablet Take 500 mg by mouth at bedtime. At bedtime 08/27/14   [provider]  montelukast (SINGULAIR) 10 MG tablet Take 10 mg by mouth at bedtime.    [provider]  naproxen (NAPROSYN) 500 MG tablet Take 500 mg by mouth 2 (two) times daily as needed for mild pain.  07/25/14   [provider]  QUEtiapine (SEROQUEL) 100 MG tablet Take 1 tablet (100 mg total) by mouth at bedtime. 09/17/19   [place] Crane, MD  venlafaxine XR (EFFEXOR XR) 75 MG 24 hr capsule Take 1 capsule (75 mg total) by mouth daily with breakfast. 09/17/19   [place] Crane, MD    Allergies Shellfish allergy and Peanuts [peanut oil]  Family History  Problem Relation Age of Onset   Depression Mother    Breast cancer Mother    Cancer Mother    Depression Father    Alcohol abuse Father    Anxiety disorder Father    Cancer Father    Depression Daughter    Diabetes Paternal Grandmother     Social History Social History   Tobacco Use   Smoking status: Former    Packs/day: 0.50    Years: 5.00    Pack years: 2.50    Types: Cigarettes    Quit date: 01/11/1998    Years since quitting: 23.7   Smokeless tobacco: Never  Vaping Use   Vaping Use: Never used  Substance Use Topics   Alcohol use: No   Drug use: Yes    Types: Marijuana    Comment: PT DENIES DURING 01-11-18 PHONE INTERVIEW BUT WAS ENTERED IN EPIC THAT SHE HAS USED [place]    Review of Systems  Review of Systems  Constitutional:  Positive for fatigue. Negative for fever.  HENT:  Negative for congestion and sore throat.   Eyes:  Negative for visual disturbance.  Respiratory:  Positive for shortness of breath. Negative for cough.    Cardiovascular:  Positive for palpitations. Negative for chest pain.  Gastrointestinal:  Positive for abdominal pain, constipation, nausea and vomiting. Negative for diarrhea.  Genitourinary:  Negative for flank pain.  Musculoskeletal:  Negative for back pain and neck pain.  Skin:  Negative for rash and wound.  Neurological:  Positive for weakness.  All other systems reviewed and are negative.   ____________________________________________  PHYSICAL EXAM:      VITAL SIGNS: ED Triage Vitals [09/22/21 1512]  Enc Vitals Group     BP (!) 127/91     Pulse Rate (!) 125     Resp (!) 21     Temp 98 F (36.7 C)     Temp Source Oral     SpO2  100 %     Weight      Height      Head Circumference      Peak Flow      Pain Score      Pain Loc      Pain Edu?      Excl. in [place]?      Physical Exam Vitals and nursing note reviewed.  Constitutional:      General: She is not in acute distress.    Appearance: She is well-developed.  HENT:     Head: Normocephalic and atraumatic.     Mouth/Throat:     Mouth: Mucous membranes are dry.  Eyes:     Conjunctiva/sclera: Conjunctivae normal.  Cardiovascular:     Rate and Rhythm: Regular rhythm. Tachycardia present.     Heart sounds: Normal heart sounds. No murmur heard.   No friction rub.  Pulmonary:     Effort: Pulmonary effort is normal. No respiratory distress.     Breath sounds: Normal breath sounds. No wheezing or rales.  Abdominal:     General: Abdomen is flat. There is no distension.     Palpations: Abdomen is soft.     Tenderness: There is no abdominal tenderness (mild, generalized).  Musculoskeletal:     Cervical back: Neck supple.  Skin:    General: Skin is warm.     Capillary Refill: Capillary refill takes less than 2 seconds.  Neurological:     Mental Status: She is alert and oriented to person, place, and time.     Motor: No abnormal muscle tone.      ____________________________________________   LABS (all labs  ordered are listed, but only abnormal results are displayed)  Labs Reviewed  COMPREHENSIVE METABOLIC PANEL - Abnormal; Notable for the following components:      Result Value   Potassium 3.1 (*)    Glucose, Bld 103 (*)    Calcium 8.7 (*)    Albumin 3.4 (*)    AST 14 (*)    All other components within normal limits  MAGNESIUM - Abnormal; Notable for the following components:   Magnesium 1.6 (*)    All other components within normal limits  CBC WITH DIFFERENTIAL/PLATELET  POC URINE PREG, ED    ____________________________________________  EKG: Sinus tachycardia, ventricular rate 128.  PR 146, QRS 66, QTc 440.  No acute ST elevations or depressions.  No EKG evidence of acute ischemia or infarct. ________________________________________  RADIOLOGY All imaging, including plain films, CT scans, and ultrasounds, independently reviewed by me, and interpretations confirmed via formal radiology reads.  ED MD interpretation:   CT AP: Diverticulosis, no acute changes  Official radiology report(s): CT ABDOMEN PELVIS W CONTRAST  Result Date: 09/22/2021 CLINICAL DATA:  Dizziness with abdominal distention and weakness. EXAM: CT ABDOMEN AND PELVIS WITH CONTRAST TECHNIQUE: Multidetector CT imaging of the abdomen and pelvis was performed using the standard protocol following bolus administration of intravenous contrast. CONTRAST:  164mL OMNIPAQUE IOHEXOL 350 MG/ML SOLN COMPARISON:  April 21, 2014 FINDINGS: Lower chest: No acute abnormality. Hepatobiliary: No focal liver abnormality is seen. The gallbladder is moderately distended. No gallstones, gallbladder wall thickening, or biliary dilatation. Pancreas: Unremarkable. No pancreatic ductal dilatation or surrounding inflammatory changes. Spleen: Normal in size without focal abnormality. Adrenals/Urinary Tract: Adrenal glands are unremarkable. Kidneys are normal, without renal calculi, focal lesion, or hydronephrosis. Bladder is unremarkable.  Stomach/Bowel: Stomach is within normal limits. Appendix appears normal. No evidence of bowel wall thickening, distention, or inflammatory changes. Small,  noninflamed diverticula are seen within the descending sigmoid colon. Vascular/Lymphatic: No significant vascular findings are present. No enlarged abdominal or pelvic lymph nodes. Reproductive: Status post hysterectomy. No adnexal masses. Other: No abdominal wall hernia or abnormality. No abdominopelvic ascites. Musculoskeletal: No acute or significant osseous findings. IMPRESSION: 1. Colonic diverticulosis. Electronically Signed   By: Virgina Norfolk M.D.   On: 09/22/2021 17:20    ____________________________________________  PROCEDURES   Procedure(s) performed (including Critical Care):  Procedures  ____________________________________________  INITIAL IMPRESSION / MDM / [place] / ED COURSE  As part of my medical decision making, I reviewed the following data within the [place] notes reviewed and incorporated, Old chart reviewed, Notes from prior ED visits, and [place] Controlled Substance Database       *Rebekah Jacobs was evaluated in Emergency Department on 09/22/2021 for the symptoms described in the history of present illness. She was evaluated in the context of the global COVID-19 pandemic, which necessitated consideration that the patient might be at risk for infection with the SARS-CoV-2 virus that causes COVID-19. Institutional protocols and algorithms that pertain to the evaluation of patients at risk for COVID-19 are in a state of rapid change based on information released by regulatory bodies including the CDC and federal and state organizations. These policies and algorithms were followed during the patient's care in the ED.  Some ED evaluations and interventions may be delayed as a result of limited staffing during the pandemic.*     Medical Decision Making:  45 yo F here with  palpitations, weakness, in setting of 1 week of n/v and dehydration. On arrival, pt is afebrile, non toxic and well appearing. She is mildly dehydrated, in sinus tachycardia but not SVT. Reviewed records from EMS - pt was in apparent SVT in 180s, received adenosine x 2.   Suspect recurrent SVT in setting of dehydration from likely viral GI illness. Labs reviewed, show mild hypokalemia and hypomag as well, which has been repleted. CT a/p obtained, reviewed, shows no acute intra-abd pathology. No significant leukocytosis or anemia noted on CBC.  Pt given fluids, PO metop with marked improvement in HR and sx. Will d/c with encouraged hydration, antiemetics for likely resolving GI illness. Given the recurrence of sx, will rx a low dose of metoprolol PRN until she can f/u with her Cardiologist.  ____________________________________________  FINAL CLINICAL IMPRESSION(S) / ED DIAGNOSES  Final diagnoses:  SVT (supraventricular tachycardia) (HCC)  Hypokalemia  Hypomagnesemia     MEDICATIONS GIVEN DURING THIS VISIT:  Medications  potassium chloride SA (KLOR-CON) CR tablet 40 mEq (has no administration in time range)  magnesium sulfate IVPB 2 g 50 mL (has no administration in time range)  ondansetron (ZOFRAN) injection 4 mg (has no administration in time range)  sodium chloride 0.9 % bolus 1,000 mL (1,000 mLs Intravenous New Bag/Given 09/22/21 1541)  lactated ringers bolus 2,000 mL (2,000 mLs Intravenous New Bag/Given 09/22/21 1539)  metoprolol tartrate (LOPRESSOR) tablet 12.5 mg (12.5 mg Oral Given 09/22/21 1540)  iohexol (OMNIPAQUE) 350 MG/ML injection 100 mL (100 mLs Intravenous Contrast Given 09/22/21 1648)     ED Discharge Orders          Ordered    potassium chloride SA (KLOR-CON) 20 MEQ tablet  Daily        09/22/21 1736    promethazine (PHENERGAN) 25 MG suppository  Every 6 hours PRN        09/22/21 1736  Note:  This document was prepared using Dragon voice  recognition software and may include unintentional dictation errors.   Shaune Pollack, MD 09/22/21 1739

## 2021-09-22 NOTE — ED Triage Notes (Signed)
Pt comes via EMs with c/o heart problems nad SVT. Pt has hx of SVT. EMs reports HR was 180s upon arrival. Pt complained of dizzy and weakness. Pt seen at PCP yesterday for fluttering in heart and weakness. Pt advised to follow back up later if symptoms worsen.  EMS gave 6 adenosine and HR 130-140. Pt HR back up to 220 and 12 adenosine given and HR stayed at 125.  Pt denies any current complaints.  MD at bedside.

## 2021-11-11 ENCOUNTER — Ambulatory Visit: Payer: Medicare Other | Admitting: Anesthesiology

## 2021-11-11 ENCOUNTER — Encounter
Admission: RE | Disposition: A | Discharge status: Home / Self Care | Payer: Self-pay | Source: Home / Self Care | Attending: Internal Medicine

## 2021-11-11 ENCOUNTER — Other Ambulatory Visit: Payer: Self-pay

## 2021-11-11 ENCOUNTER — Encounter: Payer: Self-pay | Admitting: Internal Medicine

## 2021-11-11 ENCOUNTER — Ambulatory Visit
Admission: RE | Admit: 2021-11-11 | Discharge: 2021-11-11 | Disposition: A | Discharge status: Home / Self Care | Payer: Medicare Other | Attending: Internal Medicine | Admitting: Internal Medicine

## 2021-11-11 DIAGNOSIS — Z6841 Body Mass Index (BMI) 40.0 and over, adult: Secondary | ICD-10-CM | POA: Diagnosis not present

## 2021-11-11 DIAGNOSIS — K219 Gastro-esophageal reflux disease without esophagitis: Secondary | ICD-10-CM | POA: Diagnosis not present

## 2021-11-11 DIAGNOSIS — Z800 Family history of malignant neoplasm of digestive organs: Secondary | ICD-10-CM | POA: Diagnosis not present

## 2021-11-11 DIAGNOSIS — I471 Supraventricular tachycardia: Secondary | ICD-10-CM | POA: Diagnosis not present

## 2021-11-11 DIAGNOSIS — R519 Headache, unspecified: Secondary | ICD-10-CM | POA: Diagnosis not present

## 2021-11-11 DIAGNOSIS — K5730 Diverticulosis of large intestine without perforation or abscess without bleeding: Secondary | ICD-10-CM | POA: Diagnosis not present

## 2021-11-11 DIAGNOSIS — F32A Depression, unspecified: Secondary | ICD-10-CM | POA: Insufficient documentation

## 2021-11-11 DIAGNOSIS — F4310 Post-traumatic stress disorder, unspecified: Secondary | ICD-10-CM | POA: Diagnosis not present

## 2021-11-11 DIAGNOSIS — Z1211 Encounter for screening for malignant neoplasm of colon: Secondary | ICD-10-CM | POA: Insufficient documentation

## 2021-11-11 DIAGNOSIS — I10 Essential (primary) hypertension: Secondary | ICD-10-CM | POA: Diagnosis not present

## 2021-11-11 DIAGNOSIS — F29 Unspecified psychosis not due to a substance or known physiological condition: Secondary | ICD-10-CM | POA: Insufficient documentation

## 2021-11-11 DIAGNOSIS — K640 First degree hemorrhoids: Secondary | ICD-10-CM | POA: Diagnosis not present

## 2021-11-11 DIAGNOSIS — E119 Type 2 diabetes mellitus without complications: Secondary | ICD-10-CM | POA: Insufficient documentation

## 2021-11-11 DIAGNOSIS — Z87891 Personal history of nicotine dependence: Secondary | ICD-10-CM | POA: Diagnosis not present

## 2021-11-11 HISTORY — PX: COLONOSCOPY WITH PROPOFOL: SHX5780

## 2021-11-11 LAB — GLUCOSE, CAPILLARY: Glucose-Capillary: 166 mg/dL — ABNORMAL HIGH (ref 70–99)

## 2021-11-11 SURGERY — COLONOSCOPY WITH PROPOFOL
Anesthesia: General

## 2021-11-11 MED ORDER — PROPOFOL 500 MG/50ML IV EMUL
INTRAVENOUS | Status: AC
  Filled 2021-11-11: qty 50

## 2021-11-11 MED ORDER — PROPOFOL 10 MG/ML IV BOLUS
INTRAVENOUS | Status: DC | PRN
  Administered 2021-11-11: 40 mg via INTRAVENOUS
  Administered 2021-11-11: 90 mg via INTRAVENOUS

## 2021-11-11 MED ORDER — SODIUM CHLORIDE 0.9 % IV SOLN: INTRAVENOUS | Status: DC

## 2021-11-11 MED ORDER — LIDOCAINE HCL (CARDIAC) PF 100 MG/5ML IV SOSY
PREFILLED_SYRINGE | INTRAVENOUS | Status: DC | PRN
  Administered 2021-11-11: 50 mg via INTRAVENOUS

## 2021-11-11 MED ORDER — PROPOFOL 500 MG/50ML IV EMUL
INTRAVENOUS | Status: DC | PRN
  Administered 2021-11-11: 125 ug/kg/min via INTRAVENOUS

## 2021-11-11 NOTE — H&P (Signed)
Outpatient short stay form Pre-procedure 11/11/2021 9:36 AM Rebekah Jacobs K. Rebekah Jacobs, M.D.  Primary Physician: Josephine Cables, M.D.  Reason for visit:  Colon cancer screening  History of present illness:  Patient presents for colonoscopy for colon cancer screening. The patient denies complaints of abdominal pain, significant change in bowel habits, or rectal bleeding.      Current Facility-Administered Medications:    0.9 %  sodium chloride infusion, , Intravenous, Continuous, [place], Benay Pike, MD, Last Rate: 20 mL/hr at 11/11/21 0935, Continued from Pre-op at 11/11/21 0935  Medications Prior to Admission  Medication Sig Dispense Refill Last Dose   albuterol (PROVENTIL HFA;VENTOLIN HFA) 108 (90 Base) MCG/ACT inhaler Inhale 2 puffs into the lungs every 6 (six) hours as needed. 1 Inhaler 0 Past Month   gabapentin (NEURONTIN) 300 MG capsule Take 300 mg by mouth 3 (three) times daily as needed.    11/09/2021   glipiZIDE (GLUCOTROL) 5 MG tablet Take 5 mg by mouth 2 (two) times daily before a meal.   11/09/2021   hydrochlorothiazide (MICROZIDE) 12.5 MG capsule Take 12.5 mg by mouth daily.   11/09/2021   ibuprofen (ADVIL,MOTRIN) 800 MG tablet Take 1 tablet (800 mg total) by mouth every 8 (eight) hours as needed for moderate pain. 15 tablet 0 Past Month   Semaglutide (RYBELSUS) 14 MG TABS Take 1 tablet by mouth daily.   11/09/2021   tiZANidine (ZANAFLEX) 2 MG tablet Take 2 mg by mouth.   11/09/2021   lisinopril (PRINIVIL,ZESTRIL) 10 MG tablet Take 10 mg by mouth at bedtime.   11/09/2021   lovastatin (MEVACOR) 20 MG tablet Take 20 mg by mouth at bedtime.   11/09/2021   metFORMIN (GLUCOPHAGE-XR) 500 MG 24 hr tablet Take 500 mg by mouth at bedtime. At bedtime   11/09/2021   montelukast (SINGULAIR) 10 MG tablet Take 10 mg by mouth at bedtime.   11/09/2021   naproxen (NAPROSYN) 500 MG tablet Take 500 mg by mouth 2 (two) times daily as needed for mild pain.  (Patient not taking: Reported on 11/11/2021)   Not Taking    potassium chloride SA (KLOR-CON) 20 MEQ tablet Take 1 tablet (20 mEq total) by mouth daily for 5 days. 5 tablet 0    promethazine (PHENERGAN) 25 MG suppository Place 1 suppository (25 mg total) rectally every 6 (six) hours as needed for refractory nausea / vomiting. (Patient not taking: Reported on 11/11/2021) 12 each 0 Not Taking   QUEtiapine (SEROQUEL) 100 MG tablet Take 1 tablet (100 mg total) by mouth at bedtime. 30 tablet 1 11/09/2021   venlafaxine XR (EFFEXOR XR) 75 MG 24 hr capsule Take 1 capsule (75 mg total) by mouth daily with breakfast. 30 capsule 1 11/09/2021     Allergies  Allergen Reactions   Shellfish Allergy Anaphylaxis   Peanuts [Peanut Oil] Swelling     Past Medical History:  Diagnosis Date   Complication of anesthesia    WOKE UP DURING SURGERY FOR BOTH SURGERIES   Depression    Diabetes mellitus, type II (HCC)    Family history of adverse reaction to anesthesia    MOM-HARD TO WAKE UP   Fatigue    GERD (gastroesophageal reflux disease)    Headache    Hypertension 07/16/2015   Pneumonia    BEGINNING OF FEB 2019-RESOLVED-PT STATES SHE GETS PNEUMONIA YEARLY   Psychosis ([place])    PTSD (post-traumatic stress disorder)     Review of systems:  Otherwise negative.    Physical Exam  Gen: Alert,  oriented. Appears stated age.  HEENT: [place]/AT. PERRLA. Lungs: CTA, no wheezes. CV: RR nl S1, S2. Abd: soft, benign, no masses. BS+ Ext: No edema. Pulses 2+    Planned procedures: Proceed with colonoscopy. The patient understands the nature of the planned procedure, indications, risks, alternatives and potential complications including but not limited to bleeding, infection, perforation, damage to internal organs and possible oversedation/side effects from anesthesia. The patient agrees and gives consent to proceed.  Please refer to procedure notes for findings, recommendations and patient disposition/instructions.     Rebekah Jacobs K. Rebekah Jacobs, M.D. Gastroenterology 11/11/2021  9:36  AM

## 2021-11-11 NOTE — Anesthesia Preprocedure Evaluation (Addendum)
Anesthesia Evaluation  Patient identified by MRN, date of birth, ID band Patient awake    Reviewed: Allergy & Precautions, NPO status , Patient's Chart, lab work & pertinent test results  History of Anesthesia Complications (+) Family history of anesthesia reaction and history of anesthetic complications  Airway Mallampati: III  TM Distance: >3 FB Neck ROM: full    Dental  (+) Chipped, Poor Dentition, Missing   Pulmonary neg shortness of breath, pneumonia, former smoker,    Pulmonary exam normal        Cardiovascular Exercise Tolerance: Good hypertension, (-) angina(-) Past MI and (-) DOE + dysrhythmias Supra Ventricular Tachycardia      Neuro/Psych  Headaches, PSYCHIATRIC DISORDERS    GI/Hepatic Neg liver ROS, GERD  Medicated and Controlled,  Endo/Other  diabetesMorbid obesity  Renal/GU negative Renal ROS  negative genitourinary   Musculoskeletal   Abdominal   Peds  Hematology negative hematology ROS (+)   Anesthesia Other Findings Past Medical History: No date: Complication of anesthesia     Comment:  WOKE UP DURING SURGERY FOR BOTH SURGERIES No date: Depression No date: Diabetes mellitus, type II (HCC) No date: Family history of adverse reaction to anesthesia     Comment:  MOM-HARD TO WAKE UP No date: Fatigue No date: GERD (gastroesophageal reflux disease) No date: Headache 07/16/2015: Hypertension No date: Pneumonia     Comment:  BEGINNING OF FEB 2019-RESOLVED-PT STATES SHE GETS               PNEUMONIA YEARLY No date: Psychosis ([place]) No date: PTSD (post-traumatic stress disorder)  Past Surgical History: No date: BREAST REDUCTION SURGERY 01/17/2018: CYSTOSCOPY     Comment:  Procedure: CYSTOSCOPY;  Surgeon: Gae Dry, MD;                Location: [facility] ORS;  Service: Gynecology;; 01/17/2018: LAPAROSCOPIC HYSTERECTOMY; Bilateral     Comment:  Procedure: HYSTERECTOMY TOTAL LAPAROSCOPIC WITH                BILATERAL SALPINGECTOMY;  Surgeon: Gae Dry, MD;               Location: [facility] ORS;  Service: Gynecology;  Laterality:               Bilateral; No date: NOVASURE ABLATION     Comment:  will be done 11/17/21  BMI    Body Mass Index: 49.49 kg/m      Reproductive/Obstetrics negative OB ROS                            Anesthesia Physical Anesthesia Plan  ASA: 3  Anesthesia Plan: General   Post-op Pain Management:    Induction: Intravenous  PONV Risk Score and Plan: Propofol infusion and TIVA  Airway Management Planned: Natural Airway and Nasal Cannula  Additional Equipment:   Intra-op Plan:   Post-operative Plan:   Informed Consent: I have reviewed the patients History and Physical, chart, labs and discussed the procedure including the risks, benefits and alternatives for the proposed anesthesia with the patient or authorized representative who has indicated his/her understanding and acceptance.     Dental Advisory Given  Plan Discussed with: Anesthesiologist, CRNA and Surgeon  Anesthesia Plan Comments: (Patient consented for risks of anesthesia including but not limited to:  - adverse reactions to medications - risk of airway placement if required - damage to eyes, teeth, lips or other oral mucosa - nerve damage  due to positioning  - sore throat or hoarseness - Damage to heart, brain, nerves, lungs, other parts of body or loss of life  Patient voiced understanding.)        Anesthesia Quick Evaluation

## 2021-11-11 NOTE — Transfer of Care (Signed)
Immediate Anesthesia Transfer of Care Note  Patient: Rebekah Jacobs  Procedure(s) Performed: COLONOSCOPY WITH PROPOFOL  Patient Location: PACU  Anesthesia Type:MAC  Level of Consciousness: awake, alert  and oriented  Airway & Oxygen Therapy: Patient Spontanous Breathing and Patient connected to nasal cannula oxygen  Post-op Assessment: Report given to RN and Post -op Vital signs reviewed and stable  Post vital signs: stable  Last Vitals:  Vitals Value Taken Time  BP 135/101 11/11/21 1005  Temp    Pulse 99 11/11/21 1005  Resp 15 11/11/21 1005  SpO2 99 % 11/11/21 1005  Vitals shown include unvalidated device data.  Last Pain:  Vitals:   11/11/21 0913  TempSrc: Temporal  PainSc: 0-No pain         Complications: No notable events documented.

## 2021-11-11 NOTE — Op Note (Signed)
[facility] Patient Name: Rebekah Jacobs Procedure Date: 11/11/2021 9:28 AM MRN: [id number] Account #: [id number] Date of Birth: 22-Nov-1975 Admit Type: Outpatient Age: 46 Room: [facility] ENDO ROOM 2 Gender: Female Note Status: Finalized Instrument Name: Prentice Docker [id number] Procedure:             Colonoscopy Indications:           Screening in patient at increased risk: Family history                         of 1st-degree relative with colorectal cancer before                         age 58 years Providers:             Boykin Nearing. Lissette Schenk MD, MD Medicines:             Propofol per Anesthesia Complications:         No immediate complications. Procedure:             Pre-Anesthesia Assessment:                        - The risks and benefits of the procedure and the                         sedation options and risks were discussed with the                         patient. All questions were answered and informed                         consent was obtained.                        - Patient identification and proposed procedure were                         verified prior to the procedure by the nurse. The                         procedure was verified in the procedure room.                        - ASA Grade Assessment: III - A patient with severe                         systemic disease.                        - After reviewing the risks and benefits, the patient                         was deemed in satisfactory condition to undergo the                         procedure.                        After obtaining informed consent, the colonoscope was  passed under direct vision. Throughout the procedure,                         the patient's blood pressure, pulse, and oxygen                         saturations were monitored continuously. The                         Colonoscope was introduced through the anus and                          advanced to the the cecum, identified by appendiceal                         orifice and ileocecal valve. The colonoscopy was                         performed without difficulty. The patient tolerated                         the procedure well. The quality of the bowel                         preparation was adequate. The ileocecal valve,                         appendiceal orifice, and rectum were photographed. Findings:      The perianal and digital rectal examinations were normal. Pertinent       negatives include normal sphincter tone and no palpable rectal lesions.      Non-bleeding internal hemorrhoids were found during retroflexion. The       hemorrhoids were Grade I (internal hemorrhoids that do not prolapse).      Many small-mouthed diverticula were found in the transverse colon and       left colon. There was no evidence of diverticular bleeding.      The exam was otherwise without abnormality. Impression:            - Non-bleeding internal hemorrhoids.                        - Mild diverticulosis in the transverse colon and in                         the left colon. There was no evidence of diverticular                         bleeding.                        - The examination was otherwise normal.                        - No specimens collected. Recommendation:        - Patient has a contact number available for                         emergencies. The signs and symptoms of potential  delayed complications were discussed with the patient.                         Return to normal activities tomorrow. Written                         discharge instructions were provided to the patient.                        - Resume previous diet.                        - Continue present medications.                        - Repeat colonoscopy in 5 years for screening purposes.                        - Return to GI office PRN.                        - The findings and  recommendations were discussed with                         the patient. Procedure Code(s):     --- Professional ---                        [id number], Colorectal cancer screening; colonoscopy on                         individual at high risk Diagnosis Code(s):     --- Professional ---                        K57.30, Diverticulosis of large intestine without                         perforation or abscess without bleeding                        K64.0, First degree hemorrhoids                        Z80.0, Family history of malignant neoplasm of                         digestive organs CPT copyright 2019 American Medical Association. All rights reserved. The codes documented in this report are preliminary and upon coder review may  be revised to meet current compliance requirements. Stanton Kidneyeodoro K Chazlyn Cude MD, MD 11/11/2021 10:07:47 AM This report has been signed electronically. Number of Addenda: 0 Note Initiated On: 11/11/2021 9:28 AM Scope Withdrawal Time: 0 hours 9 minutes 23 seconds  Total Procedure Duration: 0 hours 13 minutes 8 seconds  Estimated Blood Loss:  Estimated blood loss: none.      [employer] [facility]

## 2021-11-11 NOTE — Anesthesia Postprocedure Evaluation (Signed)
Anesthesia Post Note  Patient: Rebekah Jacobs  Procedure(s) Performed: COLONOSCOPY WITH PROPOFOL  Patient location during evaluation: Endoscopy Anesthesia Type: General Level of consciousness: awake and alert Pain management: pain level controlled Vital Signs Assessment: post-procedure vital signs reviewed and stable Respiratory status: spontaneous breathing, nonlabored ventilation, respiratory function stable and patient connected to nasal cannula oxygen Cardiovascular status: blood pressure returned to baseline and stable Postop Assessment: no apparent nausea or vomiting Anesthetic complications: no   No notable events documented.   Last Vitals:  Vitals:   11/11/21 1016 11/11/21 1026  BP: (!) 147/100 (!) 135/103  Pulse: 89   Resp: 15 18  Temp:    SpO2: 99% 100%    Last Pain:  Vitals:   11/11/21 1026  TempSrc:   PainSc: 0-No pain                 Cleda Mccreedy [name]

## 2021-11-11 NOTE — Interval H&P Note (Signed)
History and Physical Interval Note:  11/11/2021 9:37 AM  Rebekah Jacobs  has presented today for surgery, with the diagnosis of family history colon cancer father.  The various methods of treatment have been discussed with the patient and family. After consideration of risks, benefits and other options for treatment, the patient has consented to  Procedure(s) with comments: COLONOSCOPY WITH PROPOFOL (N/A) - DM as a surgical intervention.  The patient's history has been reviewed, patient examined, no change in status, stable for surgery.  I have reviewed the patient's chart and labs.  Questions were answered to the patient's satisfaction.     [place], [place]

## 2021-11-12 ENCOUNTER — Encounter: Payer: Self-pay | Admitting: Internal Medicine

## 2022-01-08 ENCOUNTER — Emergency Department
Admission: EM | Admit: 2022-01-08 | Discharge: 2022-01-08 | Disposition: A | Discharge status: Home / Self Care | Payer: Medicare Other | Attending: Student in an Organized Health Care Education/Training Program | Admitting: Student in an Organized Health Care Education/Training Program

## 2022-01-08 ENCOUNTER — Emergency Department: Payer: Medicare Other

## 2022-01-08 ENCOUNTER — Encounter: Payer: Self-pay | Admitting: Emergency Medicine

## 2022-01-08 ENCOUNTER — Other Ambulatory Visit: Payer: Self-pay

## 2022-01-08 DIAGNOSIS — E119 Type 2 diabetes mellitus without complications: Secondary | ICD-10-CM | POA: Diagnosis not present

## 2022-01-08 DIAGNOSIS — R000 Tachycardia, unspecified: Secondary | ICD-10-CM | POA: Diagnosis not present

## 2022-01-08 DIAGNOSIS — I10 Essential (primary) hypertension: Secondary | ICD-10-CM | POA: Insufficient documentation

## 2022-01-08 DIAGNOSIS — J069 Acute upper respiratory infection, unspecified: Secondary | ICD-10-CM | POA: Diagnosis not present

## 2022-01-08 DIAGNOSIS — R051 Acute cough: Secondary | ICD-10-CM | POA: Diagnosis present

## 2022-01-08 DIAGNOSIS — Z20822 Contact with and (suspected) exposure to covid-19: Secondary | ICD-10-CM | POA: Diagnosis not present

## 2022-01-08 LAB — RESP PANEL BY RT-PCR (FLU A&B, COVID) ARPGX2
Influenza A by PCR: NEGATIVE
Influenza B by PCR: NEGATIVE
SARS Coronavirus 2 by RT PCR: NEGATIVE

## 2022-01-08 MED ORDER — IPRATROPIUM-ALBUTEROL 0.5-2.5 (3) MG/3ML IN SOLN
3.0000 mL | Freq: Once | RESPIRATORY_TRACT | Status: AC
  Administered 2022-01-08: 3 mL via RESPIRATORY_TRACT
  Filled 2022-01-08: qty 3

## 2022-01-08 MED ORDER — GUAIFENESIN-CODEINE 100-10 MG/5ML PO SYRP
5.0000 mL | ORAL_SOLUTION | Freq: Three times a day (TID) | ORAL | 0 refills | Status: AC | PRN
Start: 2022-01-08 — End: ?

## 2022-01-08 MED ORDER — ALBUTEROL SULFATE HFA 108 (90 BASE) MCG/ACT IN AERS: 2.0000 | INHALATION_SPRAY | RESPIRATORY_TRACT | 1 refills | Status: AC | PRN

## 2022-01-08 NOTE — ED Notes (Signed)
Pt states she has history of SVT and HR is usually elevated. Pt states she had recent ablation done that was unsuccessful. EDP informed.  ?

## 2022-01-08 NOTE — ED Triage Notes (Signed)
C/O cough, sinus congestion , body aches x 2 days. Home  COVID test negative. ? ?AAOx3.  Skin warm and dry. NAD ?

## 2022-01-08 NOTE — ED Provider Notes (Signed)
? ?[facility] ?Provider Note ? ? ? Event Date/Time  ? First MD Initiated Contact with Patient 01/08/22 1515   ?  (approximate) ? ? ?History  ? ?URI ? ? ?HPI ? ?EVERETTE DIMAURO is a 46 y.o. female with history of hypertension, DM II and as listed in EMR presents to the emergency department for evaluation of cough, sinus congestion, and body aches x 3 days. Some relief with Tessalon and Robitussin. Patient states her PCP was concerned she may have pneumonia. ? ?  ? ? ?Physical Exam  ? ?Triage Vital Signs: ?ED Triage Vitals  ?Enc Vitals Group  ?   BP 01/08/22 1211 (!) 143/100  ?   Pulse Rate 01/08/22 1211 (!) 126  ?   Resp 01/08/22 1211 20  ?   Temp 01/08/22 1211 99 ?F (37.2 ?C)  ?   Temp src --   ?   SpO2 01/08/22 1211 99 %  ?   Weight 01/08/22 1145 (!) 315 lb 14.7 oz (143.3 kg)  ?   Height 01/08/22 1145 5\' 7"  (1.702 m)  ?   Head Circumference --   ?   Peak Flow --   ?   Pain Score 01/08/22 1145 0  ?   Pain Loc --   ?   Pain Edu? --   ?   Excl. in GC? --   ? ? ?Most recent vital signs: ?Vitals:  ? 01/08/22 1635 01/08/22 1637  ?BP: (!) 135/92   ?Pulse: (!) 119 (!) 120  ?Resp: 19   ?Temp:    ?SpO2: 98% 98%  ? ? ?General: Awake, no distress.  ?CV:  Good peripheral perfusion.  ?Resp:  Normal effort. Expiratory wheezing throughout ?Abd:  No distention. ?Other:   ? ? ?ED Results / Procedures / Treatments  ? ?Labs ?(all labs ordered are listed, but only abnormal results are displayed) ?Labs Reviewed  ?RESP PANEL BY RT-PCR (FLU A&B, COVID) ARPGX2  ? ? ? ?EKG ? ?Not indicated. ? ? ?RADIOLOGY ? ?Image and radiology report reviewed by me. ? ?Chest x-ray negative for acute concerns. ? ?PROCEDURES: ? ?Critical Care performed: No ? ?Procedures ? ? ?MEDICATIONS ORDERED IN ED: ?Medications  ?ipratropium-albuterol (DUONEB) 0.5-2.5 (3) MG/3ML nebulizer solution 3 mL (3 mLs Nebulization Given 01/08/22 1535)  ? ? ? ?IMPRESSION / MDM / ASSESSMENT AND PLAN / ED COURSE  ? ?I have reviewed the triage note. ? ?Differential  diagnosis includes, but is not limited to, Covid, influenza, URI, pneumonia ? ?46 year old female presents to the ER for treatment and evaluation of cough and symptoms as described in HPI.  ? ?Covid and Influenza tests are negative. DuoNeb ordered for wheezing. Will also get chest x-ray as she has concern for pneumonia. She is slightly tachycardic but normal respiratory rate and O2 sat. patient states that this is a normal heart rate for her.  She has had a cardiac ablation that has not really helped.  She denies palpitations or chest pain. ? ?Breath sounds are clear after DuoNeb.  COVID and flu tests are negative.  She will be prescribed an albuterol inhaler and cough medication.  She was encouraged to follow-up with her primary care provider if not improving over the next few days.  ER return precautions were also discussed. ?  ? ? ?FINAL CLINICAL IMPRESSION(S) / ED DIAGNOSES  ? ?Final diagnoses:  ?Upper respiratory tract infection, unspecified type  ? ? ? ?Rx / DC Orders  ? ?ED Discharge Orders   ? ?  Ordered  ?  albuterol (VENTOLIN HFA) 108 (90 Base) MCG/ACT inhaler  Every 4 hours PRN       ? 01/08/22 1636  ?  guaiFENesin-codeine (ROBITUSSIN AC) 100-10 MG/5ML syrup  3 times daily PRN       ? 01/08/22 1636  ? ?  ?  ? ?  ? ? ? ?Note:  This document was prepared using Dragon voice recognition software and may include unintentional dictation errors. ?  ?Chinita Pester, FNP ?01/08/22 1701 ? ?  ?Sharman Cheek, MD ?01/08/22 2341 ? ?

## 2022-09-20 IMAGING — CT CT ABD-PELV W/ CM
2 of 5 series · 17 of 46 positions shown, 19 images · IV contrast (APPLIED)
Comparison: April 21, 2014

CLINICAL DATA: Dizziness with abdominal distention and weakness.

EXAM:
CT ABDOMEN AND PELVIS WITH CONTRAST
TECHNIQUE: Multidetector CT imaging of the abdomen and pelvis was performed
using the standard protocol following bolus administration of
intravenous contrast.
CONTRAST:  100mL OMNIPAQUE IOHEXOL 350 MG/ML SOLN

[Series 2: axial st · axial · 0.98mm/px · z∈[-530,-55]mm · 14 of 107 slices shown, 16 images]
[im 6/107  soft-tissue]
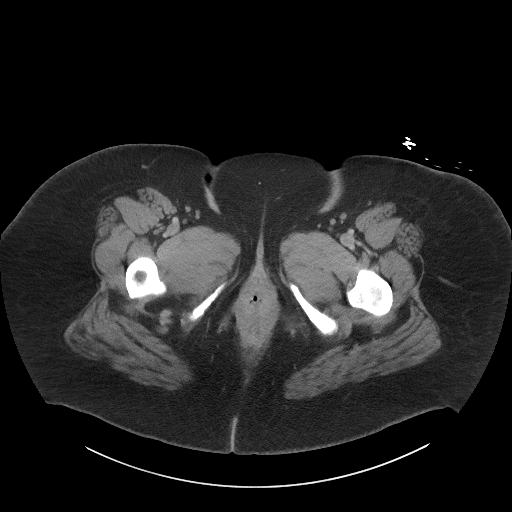
[im 6/107  bone]
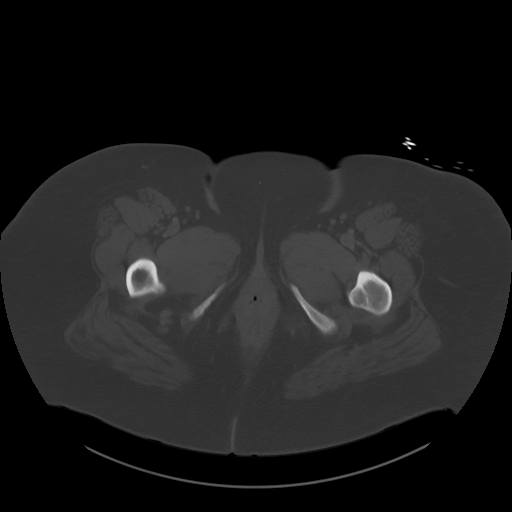
[im 16/107  soft-tissue]
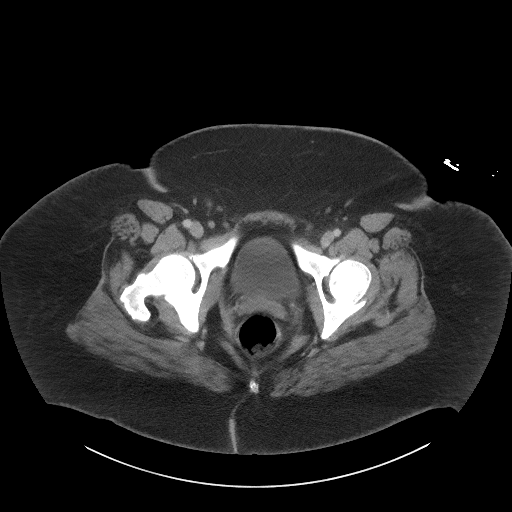
[im 21/107  soft-tissue]
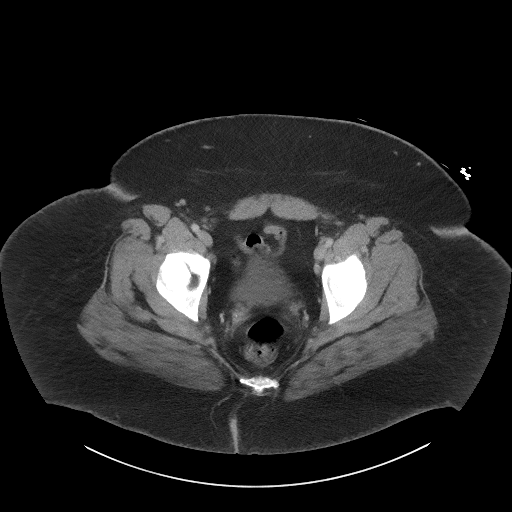
[im 31/107  soft-tissue]
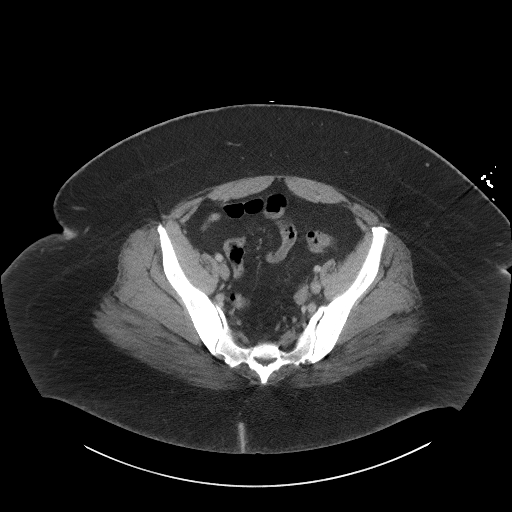
[im 36/107  soft-tissue]
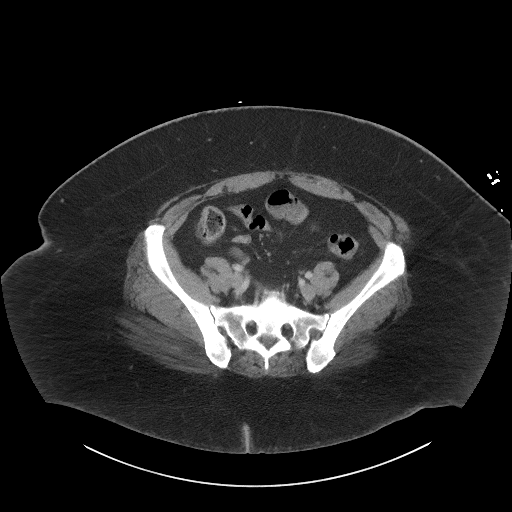
[im 41/107  soft-tissue]
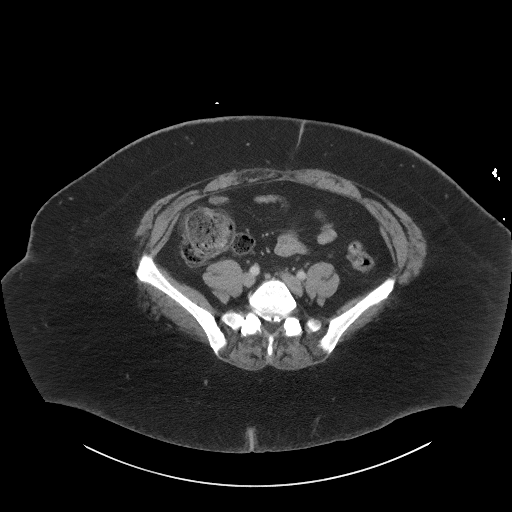
[im 51/107  soft-tissue]
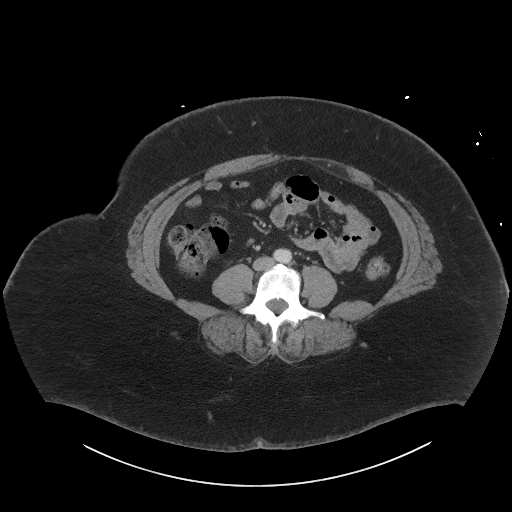
[im 56/107  soft-tissue]
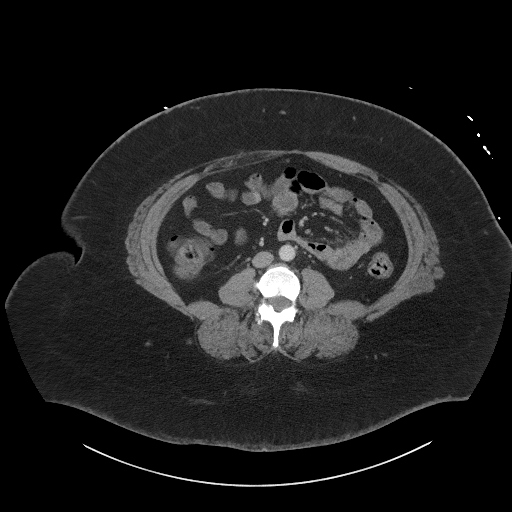
[im 66/107  soft-tissue]
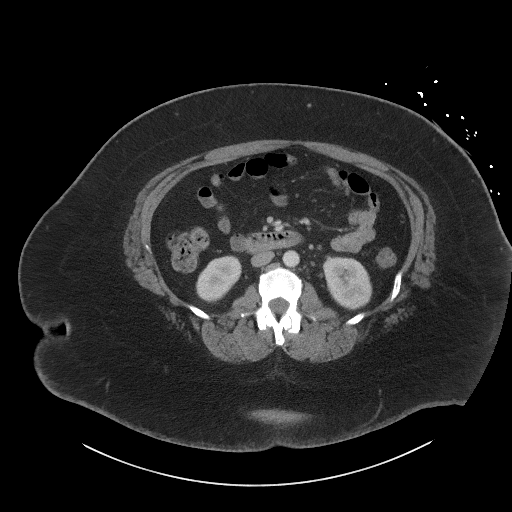
[im 66/107  bone]
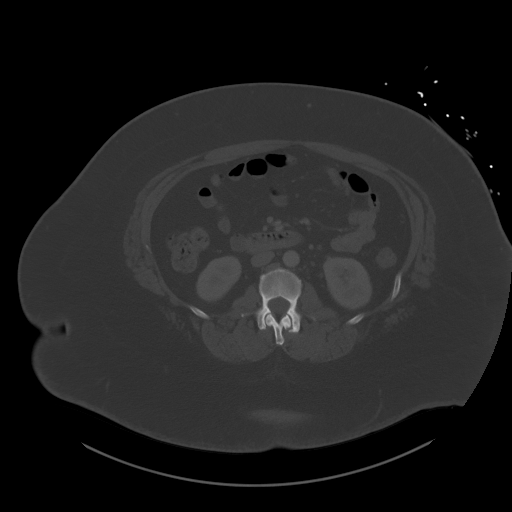
[im 71/107  soft-tissue]
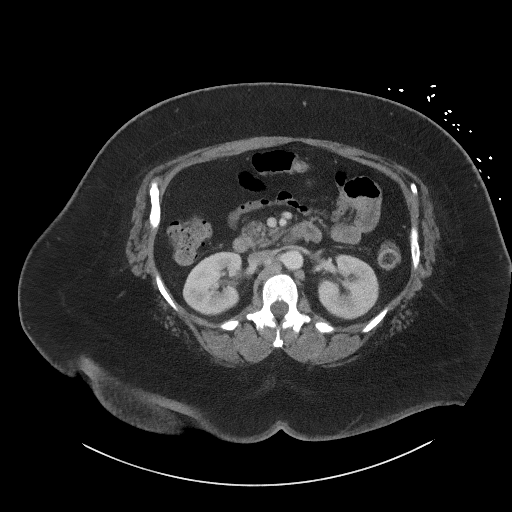
[im 81/107  soft-tissue]
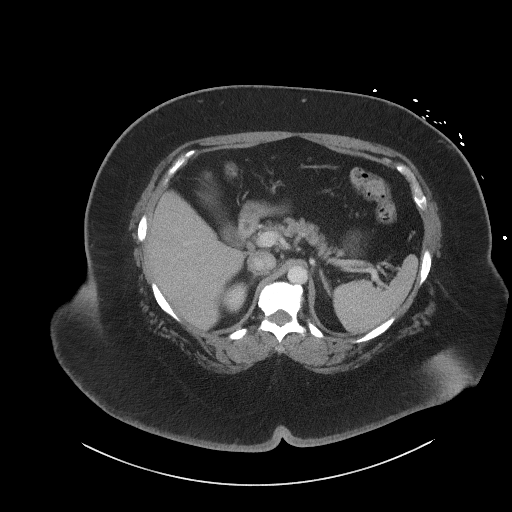
[im 86/107  soft-tissue]
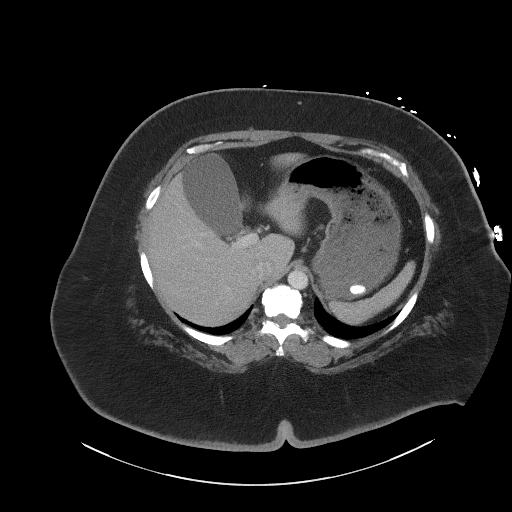
[im 91/107  soft-tissue]
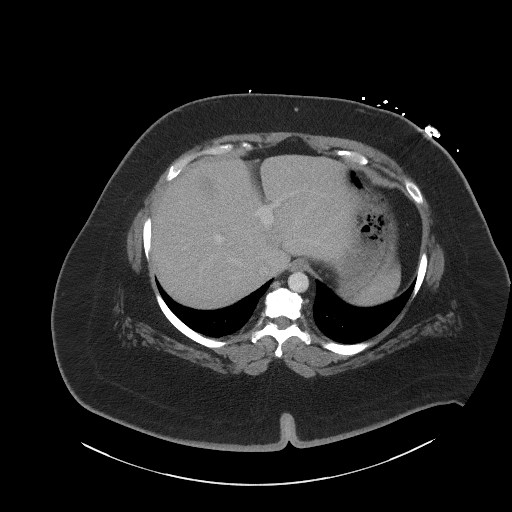
[im 101/107  soft-tissue]
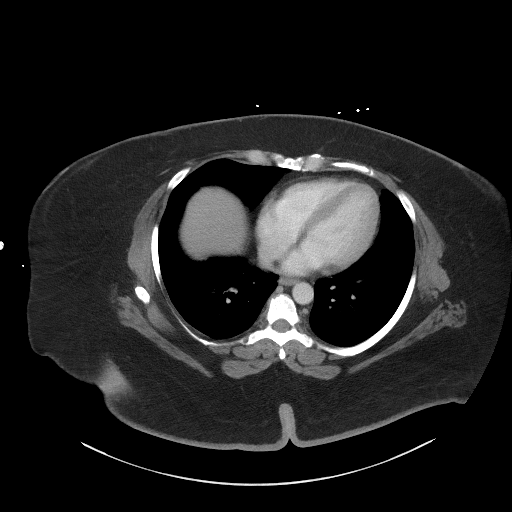

[Series 5: coronal st · coronal · 1.02mm/px · 3 of 106 slices shown]
[im 36/106  soft-tissue]
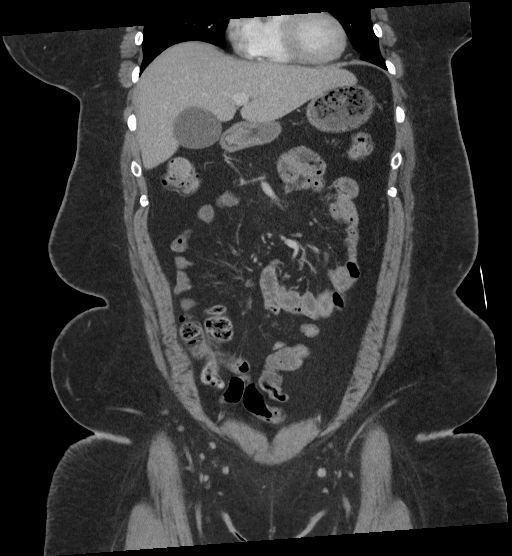
[im 47/106  soft-tissue]
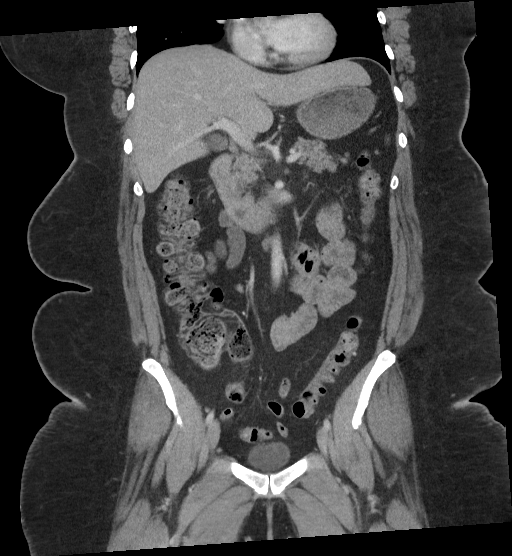
[im 59/106  soft-tissue]
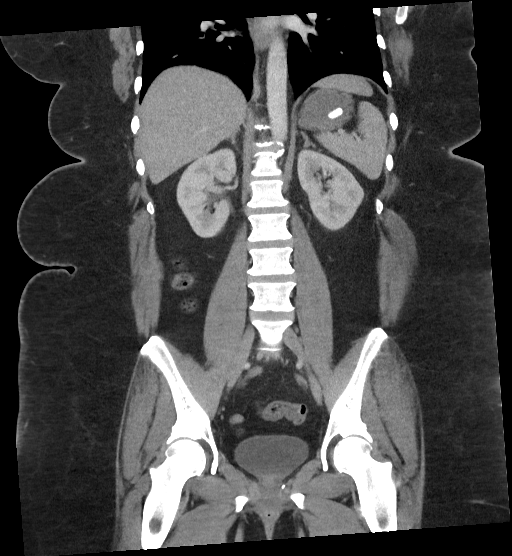

[17 of 46 positions shown; findings below may reference images not displayed]

FINDINGS: Lower chest: No acute abnormality.

Hepatobiliary: No focal liver abnormality is seen. The gallbladder
is moderately distended. No gallstones, gallbladder wall thickening,
or biliary dilatation.

Pancreas: Unremarkable. No pancreatic ductal dilatation or
surrounding inflammatory changes.

Spleen: Normal in size without focal abnormality.

Adrenals/Urinary Tract: Adrenal glands are unremarkable. Kidneys are
normal, without renal calculi, focal lesion, or hydronephrosis.
Bladder is unremarkable.

Stomach/Bowel: Stomach is within normal limits. Appendix appears
normal. No evidence of bowel wall thickening, distention, or
inflammatory changes. Small, noninflamed diverticula are seen within
the descending sigmoid colon.

Vascular/Lymphatic: No significant vascular findings are present. No
enlarged abdominal or pelvic lymph nodes.

Reproductive: Status post hysterectomy. No adnexal masses.

Other: No abdominal wall hernia or abnormality. No abdominopelvic
ascites.

Musculoskeletal: No acute or significant osseous findings.
IMPRESSION: 1. Colonic diverticulosis.

## 2022-09-28 ENCOUNTER — Other Ambulatory Visit: Payer: Self-pay | Admitting: Internal Medicine

## 2022-09-28 DIAGNOSIS — Z1231 Encounter for screening mammogram for malignant neoplasm of breast: Secondary | ICD-10-CM

## 2023-01-06 IMAGING — DX DG CHEST 1V
1 series · 1 of 1 positions shown · non-contrast
Comparison: 12/24/2015.

CLINICAL DATA: Cough and wheezing.  Body aches for 2 days.

EXAM:
CHEST  1 VIEW

[chest ap]
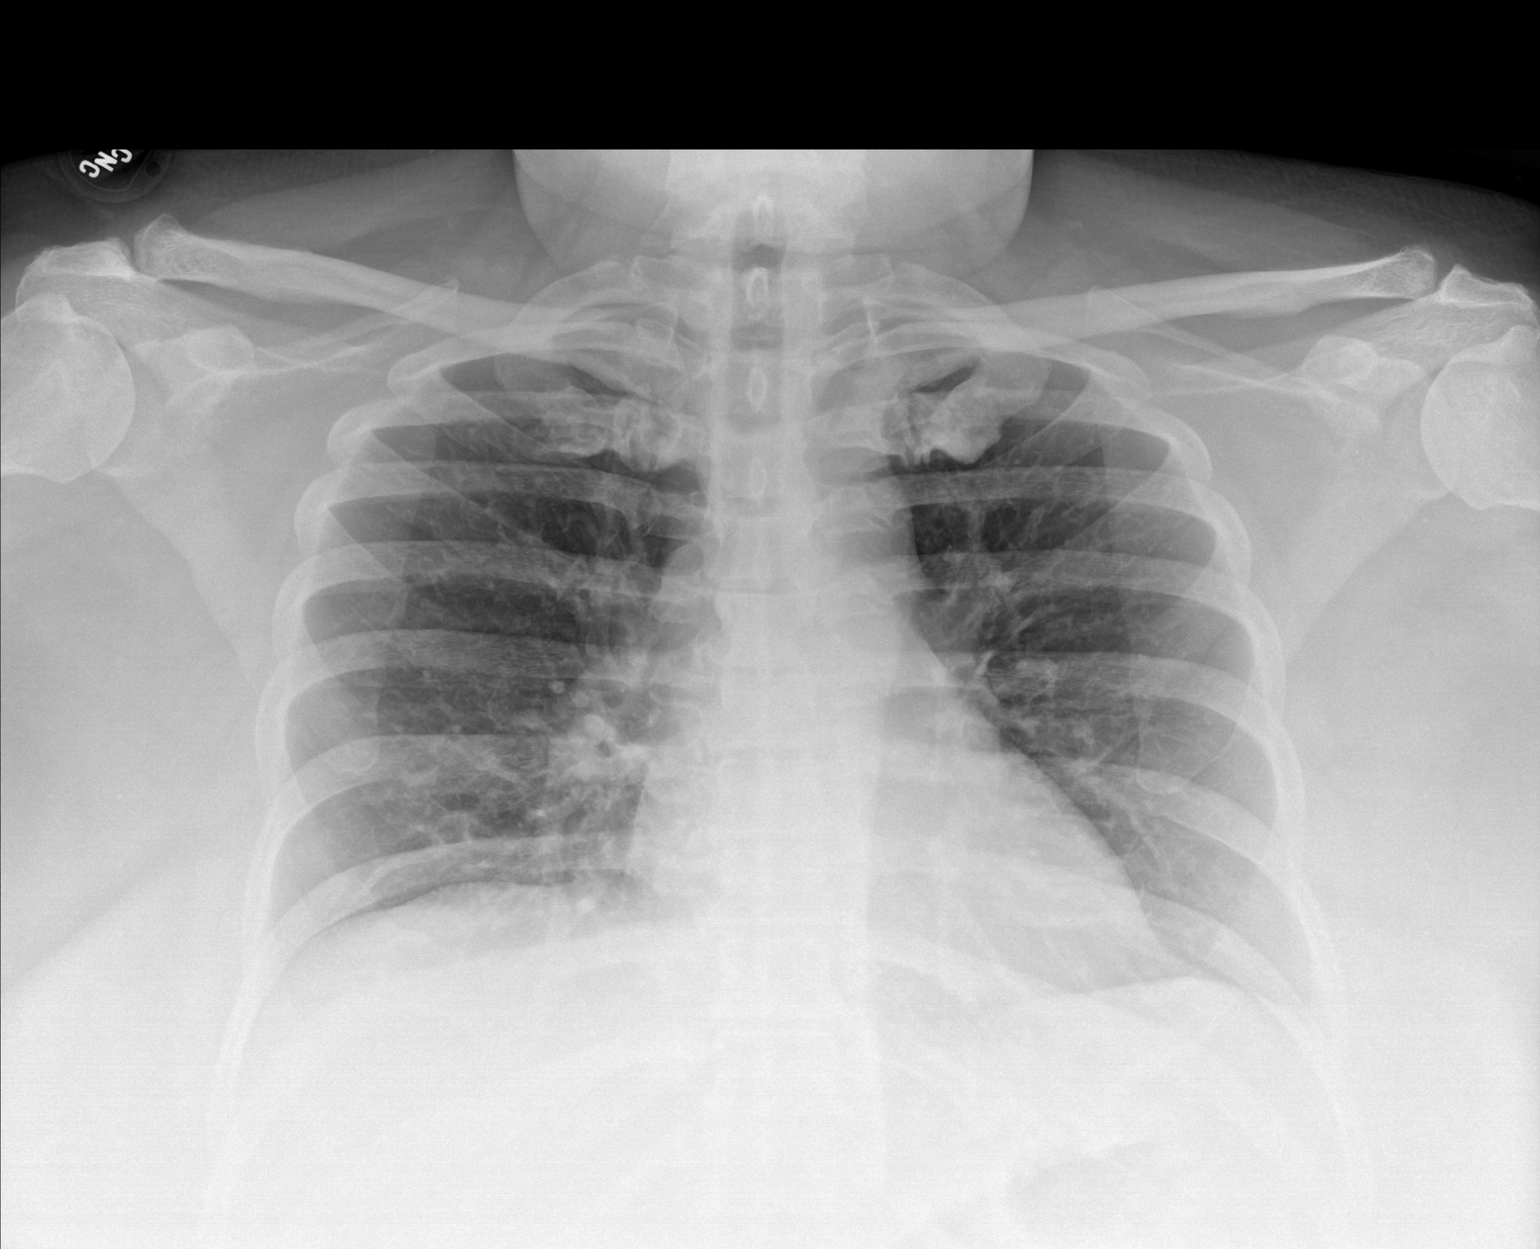

[1 of 1 positions shown; findings below may reference images not displayed]

FINDINGS: Cardiac silhouette normal in size and configuration. Normal
mediastinal and hilar contours.

Clear lungs.  No pleural effusion or pneumothorax.

Skeletal structures are grossly intact.
IMPRESSION: No active disease.

## 2023-03-23 ENCOUNTER — Ambulatory Visit
Admission: RE | Admit: 2023-03-23 | Discharge: 2023-03-23 | Disposition: A | Discharge status: Home / Self Care | Payer: 59 | Source: Ambulatory Visit | Attending: Internal Medicine | Admitting: Internal Medicine

## 2023-03-23 DIAGNOSIS — Z1231 Encounter for screening mammogram for malignant neoplasm of breast: Secondary | ICD-10-CM | POA: Insufficient documentation

## 2023-07-11 ENCOUNTER — Encounter
Admission: EM | Disposition: A | Discharge status: Home / Self Care | Payer: Self-pay | Source: Home / Self Care | Attending: Emergency Medicine

## 2023-07-11 ENCOUNTER — Observation Stay
Admission: EM | Admit: 2023-07-11 | Discharge: 2023-07-12 | Disposition: A | Discharge status: Home / Self Care | Payer: 59 | Attending: Surgery | Admitting: Surgery

## 2023-07-11 ENCOUNTER — Observation Stay: Payer: 59 | Admitting: Anesthesiology

## 2023-07-11 ENCOUNTER — Emergency Department: Payer: 59

## 2023-07-11 ENCOUNTER — Other Ambulatory Visit: Payer: Self-pay

## 2023-07-11 DIAGNOSIS — K8012 Calculus of gallbladder with acute and chronic cholecystitis without obstruction: Secondary | ICD-10-CM | POA: Diagnosis not present

## 2023-07-11 DIAGNOSIS — Z87891 Personal history of nicotine dependence: Secondary | ICD-10-CM | POA: Insufficient documentation

## 2023-07-11 DIAGNOSIS — Z91010 Allergy to peanuts: Secondary | ICD-10-CM | POA: Diagnosis not present

## 2023-07-11 DIAGNOSIS — I10 Essential (primary) hypertension: Secondary | ICD-10-CM | POA: Insufficient documentation

## 2023-07-11 DIAGNOSIS — K8000 Calculus of gallbladder with acute cholecystitis without obstruction: Secondary | ICD-10-CM | POA: Diagnosis present

## 2023-07-11 DIAGNOSIS — R072 Precordial pain: Secondary | ICD-10-CM | POA: Diagnosis present

## 2023-07-11 DIAGNOSIS — Z9884 Bariatric surgery status: Secondary | ICD-10-CM | POA: Diagnosis not present

## 2023-07-11 DIAGNOSIS — E119 Type 2 diabetes mellitus without complications: Secondary | ICD-10-CM | POA: Insufficient documentation

## 2023-07-11 DIAGNOSIS — R7401 Elevation of levels of liver transaminase levels: Secondary | ICD-10-CM

## 2023-07-11 DIAGNOSIS — R1013 Epigastric pain: Principal | ICD-10-CM

## 2023-07-11 DIAGNOSIS — R110 Nausea: Secondary | ICD-10-CM

## 2023-07-11 HISTORY — DX: Calculus of gallbladder with acute cholecystitis without obstruction: K80.00

## 2023-07-11 LAB — COMPREHENSIVE METABOLIC PANEL
ALT: 192 U/L — ABNORMAL HIGH (ref 0–44)
AST: 344 U/L — ABNORMAL HIGH (ref 15–41)
Albumin: 2.7 g/dL — ABNORMAL LOW (ref 3.5–5.0)
Alkaline Phosphatase: 92 U/L (ref 38–126)
Anion gap: 7 (ref 5–15)
BUN: 17 mg/dL (ref 6–20)
CO2: 21 mmol/L — ABNORMAL LOW (ref 22–32)
Calcium: 8 mg/dL — ABNORMAL LOW (ref 8.9–10.3)
Chloride: 106 mmol/L (ref 98–111)
Creatinine, Ser: 0.61 mg/dL (ref 0.44–1.00)
GFR, Estimated: >60 mL/min (ref >60)
Glucose, Bld: 83 mg/dL (ref 70–99)
Potassium: 4.1 mmol/L (ref 3.5–5.1)
Sodium: 134 mmol/L — ABNORMAL LOW (ref 135–145)
Total Bilirubin: 1.3 mg/dL — ABNORMAL HIGH (ref 0.3–1.2)
Total Protein: 5.4 g/dL — ABNORMAL LOW (ref 6.5–8.1)

## 2023-07-11 LAB — CBC WITH DIFFERENTIAL/PLATELET
Abs Immature Granulocytes: 0.02 10*3/uL (ref 0.00–0.07)
Basophils Absolute: 0 10*3/uL (ref 0.0–0.1)
Basophils Relative: 0 %
Eosinophils Absolute: 0.1 10*3/uL (ref 0.0–0.5)
Eosinophils Relative: 2 %
HCT: 41 % (ref 36.0–46.0)
Hemoglobin: 13.3 g/dL (ref 12.0–15.0)
Immature Granulocytes: 0 %
Lymphocytes Relative: 32 %
Lymphs Abs: 1.5 10*3/uL (ref 0.7–4.0)
MCH: 28.9 pg (ref 26.0–34.0)
MCHC: 32.4 g/dL (ref 30.0–36.0)
MCV: 88.9 fL (ref 80.0–100.0)
Monocytes Absolute: 0.4 10*3/uL (ref 0.1–1.0)
Monocytes Relative: 8 %
Neutro Abs: 2.6 10*3/uL (ref 1.7–7.7)
Neutrophils Relative %: 58 %
Platelets: 241 10*3/uL (ref 150–400)
RBC: 4.61 MIL/uL (ref 3.87–5.11)
RDW: 13.9 % (ref 11.5–15.5)
WBC: 4.5 10*3/uL (ref 4.0–10.5)
nRBC: 0 % (ref 0.0–0.2)

## 2023-07-11 LAB — LIPASE, BLOOD: Lipase: 31 U/L (ref 11–51)

## 2023-07-11 LAB — PHOSPHORUS: Phosphorus: 2.2 mg/dL — ABNORMAL LOW (ref 2.5–4.6)

## 2023-07-11 LAB — TROPONIN I (HIGH SENSITIVITY)
Troponin I (High Sensitivity): <2 ng/L (ref <18)
Troponin I (High Sensitivity): <2 ng/L (ref <18)

## 2023-07-11 LAB — MAGNESIUM: Magnesium: 1.4 mg/dL — ABNORMAL LOW (ref 1.7–2.4)

## 2023-07-11 SURGERY — CHOLECYSTECTOMY, ROBOT-ASSISTED, LAPAROSCOPIC
Anesthesia: General

## 2023-07-11 MED ORDER — PHENYLEPHRINE HCL (PRESSORS) 10 MG/ML IV SOLN
INTRAVENOUS | Status: DC | PRN
  Administered 2023-07-11: 80 ug via INTRAVENOUS

## 2023-07-11 MED ORDER — LIDOCAINE HCL (CARDIAC) PF 100 MG/5ML IV SOSY
PREFILLED_SYRINGE | INTRAVENOUS | Status: DC | PRN
  Administered 2023-07-11: 100 mg via INTRAVENOUS

## 2023-07-11 MED ORDER — DEXAMETHASONE SODIUM PHOSPHATE 10 MG/ML IJ SOLN
INTRAMUSCULAR | Status: DC | PRN
  Administered 2023-07-11: 10 mg via INTRAVENOUS

## 2023-07-11 MED ORDER — 0.9 % SODIUM CHLORIDE (POUR BTL) OPTIME
TOPICAL | Status: DC | PRN
  Administered 2023-07-11: 500 mL

## 2023-07-11 MED ORDER — BUPIVACAINE-EPINEPHRINE (PF) 0.25% -1:200000 IJ SOLN
INTRAMUSCULAR | Status: AC
  Filled 2023-07-11: qty 30

## 2023-07-11 MED ORDER — IOHEXOL 350 MG/ML SOLN
100.0000 mL | Freq: Once | INTRAVENOUS | Status: AC | PRN
  Administered 2023-07-11: 100 mL via INTRAVENOUS

## 2023-07-11 MED ORDER — BUPIVACAINE-EPINEPHRINE (PF) 0.25% -1:200000 IJ SOLN
INTRAMUSCULAR | Status: DC | PRN
  Administered 2023-07-11: 50 mL via SUBCUTANEOUS

## 2023-07-11 MED ORDER — ONDANSETRON HCL 4 MG/2ML IJ SOLN
INTRAMUSCULAR | Status: DC | PRN
  Administered 2023-07-11: 4 mg via INTRAVENOUS

## 2023-07-11 MED ORDER — LACTATED RINGERS IV SOLN: INTRAVENOUS | Status: DC | PRN

## 2023-07-11 MED ORDER — BUPIVACAINE LIPOSOME 1.3 % IJ SUSP
INTRAMUSCULAR | Status: AC
  Filled 2023-07-11: qty 20

## 2023-07-11 MED ORDER — MIDAZOLAM HCL 2 MG/2ML IJ SOLN
INTRAMUSCULAR | Status: DC | PRN
  Administered 2023-07-11: 2 mg via INTRAVENOUS

## 2023-07-11 MED ORDER — MIDAZOLAM HCL 2 MG/2ML IJ SOLN
INTRAMUSCULAR | Status: AC
  Filled 2023-07-11: qty 2

## 2023-07-11 MED ORDER — CEFAZOLIN IN SODIUM CHLORIDE 3-0.9 GM/100ML-% IV SOLN
3.0000 g | Freq: Three times a day (TID) | INTRAVENOUS | Status: DC
  Administered 2023-07-11: 2 g via INTRAVENOUS
  Filled 2023-07-11 (×3): qty 100

## 2023-07-11 MED ORDER — LIDOCAINE HCL (PF) 2 % IJ SOLN
INTRAMUSCULAR | Status: AC
  Filled 2023-07-11: qty 5

## 2023-07-11 MED ORDER — FENTANYL CITRATE (PF) 100 MCG/2ML IJ SOLN
INTRAMUSCULAR | Status: DC | PRN
  Administered 2023-07-11: 100 ug via INTRAVENOUS

## 2023-07-11 MED ORDER — INDOCYANINE GREEN 25 MG IV SOLR
2.5000 mg | Freq: Once | INTRAVENOUS | Status: AC
  Administered 2023-07-11: 2.5 mg via INTRAVENOUS
  Filled 2023-07-11: qty 1

## 2023-07-11 MED ORDER — MAGNESIUM SULFATE 2 GM/50ML IV SOLN
2.0000 g | Freq: Once | INTRAVENOUS | Status: AC
  Administered 2023-07-11: 2 g via INTRAVENOUS
  Filled 2023-07-11: qty 50

## 2023-07-11 MED ORDER — SUGAMMADEX SODIUM 200 MG/2ML IV SOLN
INTRAVENOUS | Status: DC | PRN
  Administered 2023-07-11: 200 mg via INTRAVENOUS

## 2023-07-11 MED ORDER — LACTATED RINGERS IV BOLUS
1000.0000 mL | Freq: Once | INTRAVENOUS | Status: AC
  Administered 2023-07-11: 1000 mL via INTRAVENOUS

## 2023-07-11 MED ORDER — PROPOFOL 10 MG/ML IV BOLUS
INTRAVENOUS | Status: AC
  Filled 2023-07-11: qty 20

## 2023-07-11 MED ORDER — ONDANSETRON HCL 4 MG/2ML IJ SOLN
4.0000 mg | Freq: Once | INTRAMUSCULAR | Status: AC
  Administered 2023-07-11: 4 mg via INTRAVENOUS
  Filled 2023-07-11: qty 2

## 2023-07-11 MED ORDER — SODIUM CHLORIDE 0.9 % IV SOLN: INTRAVENOUS | Status: DC

## 2023-07-11 MED ORDER — PHENYLEPHRINE 80 MCG/ML (10ML) SYRINGE FOR IV PUSH (FOR BLOOD PRESSURE SUPPORT)
PREFILLED_SYRINGE | INTRAVENOUS | Status: AC
  Filled 2023-07-11: qty 10

## 2023-07-11 MED ORDER — FENTANYL CITRATE (PF) 250 MCG/5ML IJ SOLN
INTRAMUSCULAR | Status: AC
  Filled 2023-07-11: qty 5

## 2023-07-11 MED ORDER — ROCURONIUM BROMIDE 100 MG/10ML IV SOLN
INTRAVENOUS | Status: DC | PRN
  Administered 2023-07-11: 80 mg via INTRAVENOUS

## 2023-07-11 MED ORDER — PROPOFOL 10 MG/ML IV BOLUS
INTRAVENOUS | Status: DC | PRN
  Administered 2023-07-11: 150 mg via INTRAVENOUS

## 2023-07-11 MED ORDER — PANTOPRAZOLE SODIUM 40 MG IV SOLR
40.0000 mg | Freq: Once | INTRAVENOUS | Status: AC
  Administered 2023-07-11: 40 mg via INTRAVENOUS
  Filled 2023-07-11: qty 10

## 2023-07-11 SURGICAL SUPPLY — 41 items
ADH SKN CLS APL DERMABOND .7 (GAUZE/BANDAGES/DRESSINGS) ×2
CLIP LIGATING HEM O LOK PURPLE (MISCELLANEOUS) ×2 IMPLANT
COVER TIP SHEARS 8 DVNC (MISCELLANEOUS) ×2 IMPLANT
DERMABOND ADVANCED .7 DNX12 (GAUZE/BANDAGES/DRESSINGS) ×2 IMPLANT
DRAPE ARM DVNC X/XI (DISPOSABLE) ×8 IMPLANT
DRAPE COLUMN DVNC XI (DISPOSABLE) ×2 IMPLANT
ELECT CAUTERY BLADE 6.4 (BLADE) ×2 IMPLANT
FORCEPS BPLR R/ABLATION 8 DVNC (INSTRUMENTS) ×2 IMPLANT
FORCEPS PROGRASP DVNC XI (FORCEP) ×2 IMPLANT
GLOVE ORTHO TXT STRL SZ7.5 (GLOVE) ×4 IMPLANT
GOWN STRL REUS W/ TWL LRG LVL3 (GOWN DISPOSABLE) ×4 IMPLANT
GOWN STRL REUS W/ TWL XL LVL3 (GOWN DISPOSABLE) ×4 IMPLANT
GOWN STRL REUS W/TWL LRG LVL3 (GOWN DISPOSABLE) ×4
GOWN STRL REUS W/TWL XL LVL3 (GOWN DISPOSABLE) ×4
GRASPER SUT TROCAR 14GX15 (MISCELLANEOUS) ×2 IMPLANT
KIT PINK PAD W/HEAD ARE REST (MISCELLANEOUS) ×2 IMPLANT
KIT PINK PAD W/HEAD ARM REST (MISCELLANEOUS) ×2 IMPLANT
KIT TURNOVER KIT A (KITS) ×2 IMPLANT
LABEL OR SOLS (LABEL) ×2 IMPLANT
MANIFOLD NEPTUNE II (INSTRUMENTS) ×2 IMPLANT
NDL HYPO 22X1.5 SAFETY MO (MISCELLANEOUS) ×2 IMPLANT
NDL INSUFFLATION 14GA 120MM (NEEDLE) ×2 IMPLANT
NEEDLE HYPO 22X1.5 SAFETY MO (MISCELLANEOUS) ×2 IMPLANT
NEEDLE INSUFFLATION 14GA 120MM (NEEDLE) ×2 IMPLANT
NS IRRIG 500ML POUR BTL (IV SOLUTION) ×2 IMPLANT
PACK LAP CHOLECYSTECTOMY (MISCELLANEOUS) ×2 IMPLANT
SCISSORS MNPLR CVD DVNC XI (INSTRUMENTS) ×2 IMPLANT
SEAL UNIV 5-12 XI (MISCELLANEOUS) ×8 IMPLANT
SET TUBE SMOKE EVAC HIGH FLOW (TUBING) ×2 IMPLANT
SOL ELECTROSURG ANTI STICK (MISCELLANEOUS) ×2
SOLUTION ELECTROSURG ANTI STCK (MISCELLANEOUS) ×2 IMPLANT
SPIKE FLUID TRANSFER (MISCELLANEOUS) ×2 IMPLANT
SUT MNCRL 4-0 (SUTURE) ×2
SUT MNCRL 4-0 27XMFL (SUTURE) ×2
SUT VICRYL 0 UR6 27IN ABS (SUTURE) ×2 IMPLANT
SUTURE MNCRL 4-0 27XMF (SUTURE) ×2 IMPLANT
SYS BAG RETRIEVAL 10MM (BASKET) ×2
SYSTEM BAG RETRIEVAL 10MM (BASKET) ×2 IMPLANT
TRAP FLUID SMOKE EVACUATOR (MISCELLANEOUS) ×2 IMPLANT
TROCAR Z-THREAD FIOS 11X100 BL (TROCAR) ×2 IMPLANT
WATER STERILE IRR 500ML POUR (IV SOLUTION) ×2 IMPLANT

## 2023-07-11 NOTE — Interval H&P Note (Signed)
History and Physical Interval Note:  07/11/2023 9:46 PM  Rebekah Jacobs  has presented today for surgery, with the diagnosis of acute calculus cholecystitis.  The various methods of treatment have been discussed with the patient and family. After consideration of risks, benefits and other options for treatment, the patient has consented to  Procedure(s): XI ROBOTIC ASSISTED LAPAROSCOPIC CHOLECYSTECTOMY (N/A) as a surgical intervention.  The patient's history has been reviewed, patient examined, no change in status, stable for surgery.  I have reviewed the patient's chart and labs.  Questions were answered to the patient's satisfaction.     Campbell Lerner

## 2023-07-11 NOTE — ED Provider Notes (Addendum)
-----------------------------------------   5:34 PM on 07/11/2023 -----------------------------------------  I took over care of this patient from Dr. Anner Crete.  CT shows distended gallbladder but no other acute findings.  Given this finding and the elevated LFTs I have ordered an ultrasound for further evaluation.  The patient states that her pain is improved.  IMPRESSION:  1. Mild gallbladder distension. No radiodense gallstones or  inflammation identified.  2. No evidence of acute pancreatitis.  3. Post bariatric surgery without complication.   ----------------------------------------- 8:30 PM on 07/11/2023 -----------------------------------------  I independently viewed and interpreted the images from the ultrasound, which shows a gallstone in the gallbladder neck.  There is no pericholecystic fluid or wall thickening.  CBD diameter is normal.  However, given the ultrasound findings and the elevated LFTs and the patient's acute symptoms, I consulted Dr. Claudine Mouton from surgery to evaluate the patient for consideration of cholecystectomy.  Based on our discussion, he has agreed to admit the patient for cholecystectomy tonight.   Dionne Bucy, MD 07/11/23 2232

## 2023-07-11 NOTE — H&P (Signed)
Patient ID: Rebekah Jacobs, female   DOB: 02/16/1976, 47 y.o.   MRN: [id number]  Chief Complaint: Substernal chest pain  History of Present Illness Rebekah Jacobs is a 47 y.o. female with substernal chest pain, diaphoresis, nausea for few hours this morning.  Was having some right upper quadrant pain over the weekend.  This morning attempted to have a portion of a biscuit some eggs and sausage but it all came back up.  Attempted to carry on however got clammy, weak and feeling faint while attempting to shop.  Was brought to the ER where she underwent a workup including a CT scan, abdominal ultrasound, and lab work.  Elevated transaminases, a stone apparently in the neck of the gallbladder or causes of concern for acute symptomatic cholecystitis with cholelithiasis. She had a gastric bypass about a year ago at [facility], was told at that time she may eventually need her gallbladder out.  Past Medical History Past Medical History:  Diagnosis Date   Complication of anesthesia    WOKE UP DURING SURGERY FOR BOTH SURGERIES   Depression    Diabetes mellitus, type II (HCC)    Family history of adverse reaction to anesthesia    MOM-HARD TO WAKE UP   Fatigue    GERD (gastroesophageal reflux disease)    Headache    Hypertension 07/16/2015   Pneumonia    BEGINNING OF FEB 2019-RESOLVED-PT STATES SHE GETS PNEUMONIA YEARLY   Psychosis (HCC)    PTSD (post-traumatic stress disorder)       Past Surgical History:  Procedure Laterality Date   BREAST REDUCTION SURGERY     COLONOSCOPY WITH PROPOFOL N/A 11/11/2021   Procedure: COLONOSCOPY WITH PROPOFOL;  Surgeon: Toledo, Boykin Nearing, MD;  Location: [facility] ENDOSCOPY;  Service: Gastroenterology;  Laterality: N/A;  DM   CYSTOSCOPY  01/17/2018   Procedure: CYSTOSCOPY;  Surgeon: Nadara Mustard, MD;  Location: [facility] ORS;  Service: Gynecology;;   LAPAROSCOPIC HYSTERECTOMY Bilateral 01/17/2018   Procedure: HYSTERECTOMY TOTAL LAPAROSCOPIC WITH BILATERAL SALPINGECTOMY;   Surgeon: Nadara Mustard, MD;  Location: [facility] ORS;  Service: Gynecology;  Laterality: Bilateral;   NOVASURE ABLATION     will be done 11/17/21   REDUCTION MAMMAPLASTY Bilateral 2005    Allergies  Allergen Reactions   Shellfish Allergy Anaphylaxis   Peanuts [Peanut Oil] Swelling    Current Facility-Administered Medications  Medication Dose Route Frequency Provider Last Rate Last Admin   0.9 %  sodium chloride infusion   Intravenous Continuous Campbell Lerner, MD       Mitzi Hansen Hold] ceFAZolin (ANCEF) IVPB 3g/100 mL premix  3 g Intravenous Q8H Campbell Lerner, MD       indocyanine green (IC-GREEN) injection 2.5 mg  2.5 mg Intravenous Once Campbell Lerner, MD        Family History Family History  Problem Relation Age of Onset   Depression Mother    Breast cancer Mother 82   Cancer Mother    Depression Father    Alcohol abuse Father    Anxiety disorder Father    Cancer Father    Depression Daughter    Diabetes Paternal Grandmother       Social History Social History   Tobacco Use   Smoking status: Former    Current packs/day: 0.00    Average packs/day: 0.5 packs/day for 5.0 years (2.5 ttl pk-yrs)    Types: Cigarettes    Start date: 01/11/1993    Quit date: 01/11/1998    Years since quitting: 25.5  Smokeless tobacco: Never  Vaping Use   Vaping status: Never Used  Substance Use Topics   Alcohol use: No   Drug use: Yes    Types: Marijuana    Comment: PT DENIES DURING 01-11-18 PHONE INTERVIEW BUT WAS ENTERED IN EPIC THAT SHE HAS USED MARIJUAN     Physical Exam Blood pressure 132/81, pulse 93, temperature 98 F (36.7 C), resp. rate (!) 0, height 5\' 7"  (1.702 m), weight 101.2 kg, last menstrual period 12/29/2017, SpO2 100%. Last Weight  Most recent update: 07/11/2023  9:32 PM    Weight  101.2 kg (223 lb)             CONSTITUTIONAL: Well developed, and nourished, appropriately responsive and aware without distress.   EYES: Sclera non-icteric.   EARS, NOSE, MOUTH AND  THROAT:  The oropharynx is clear. Oral mucosa is pink and moist.    Hearing is intact to voice.  NECK: Trachea is midline, and there is no jugular venous distension.  LYMPH NODES:  Lymph nodes in the neck were not appreciated. RESPIRATORY:  Lungs are clear, and breath sounds are equal bilaterally.  Normal respiratory effort without pathologic use of accessory muscles. CARDIOVASCULAR: Heart is regular in rate and rhythm.   Well perfused.  GI: The abdomen is moderately tender in the RUQ and epigastrium, o/w soft, nontender, and nondistended. There were no palpable masses.  I did not appreciate hepatosplenomegaly.  MUSCULOSKELETAL:  Symmetrical muscle tone appreciated in all four extremities.    SKIN: Skin turgor is normal. No pathologic skin lesions appreciated.  NEUROLOGIC:  Motor and sensation appear grossly normal.  Cranial nerves are grossly without defect. PSYCH:  Alert and oriented to person, place and time. Affect is appropriate for situation.  Data Reviewed I have personally reviewed what is currently available of the patient's imaging, recent labs and medical records.   Labs:     Latest Ref Rng & Units 07/11/2023   12:06 PM 09/22/2021    3:08 PM 01/18/2018    5:45 AM  CBC  WBC 4.0 - 10.5 K/uL 4.5  7.4    Hemoglobin 12.0 - 15.0 g/dL 13.2  44.0  10.2   Hematocrit 36.0 - 46.0 % 41.0  42.3    Platelets 150 - 400 K/uL 241  349        Latest Ref Rng & Units 07/11/2023    2:13 PM 09/22/2021    3:53 PM 01/12/2018    1:42 PM  CMP  Glucose 70 - 99 mg/dL 83  725    BUN 6 - 20 mg/dL 17  13    Creatinine 3.66 - 1.00 mg/dL 4.40  3.47  4.25   Sodium 135 - 145 mmol/L 134  137    Potassium 3.5 - 5.1 mmol/L 4.1  3.1    Chloride 98 - 111 mmol/L 106  101    CO2 22 - 32 mmol/L 21  27    Calcium 8.9 - 10.3 mg/dL 8.0  8.7    Total Protein 6.5 - 8.1 g/dL 5.4  6.8    Total Bilirubin 0.3 - 1.2 mg/dL 1.3  0.9    Alkaline Phos 38 - 126 U/L 92  48    AST 15 - 41 U/L 344  14    ALT 0 - 44 U/L 192  15         Imaging: Radiological images reviewed:   Within last 24 hrs: US Abdomen Limited RUQ (LIVER/GB)  Result Date: 07/11/2023 CLINICAL DATA:  Elevated LFTs EXAM: ULTRASOUND ABDOMEN LIMITED RIGHT UPPER QUADRANT COMPARISON:  None Available. FINDINGS: Gallbladder: Stone or sludge is present within the gallbladder. No wall thickening visualized. No sonographic Murphy sign noted by sonographer. Common bile duct: Diameter: 3 mm.  No intrahepatic biliary dilation. Liver: No focal lesion identified. Within normal limits in parenchymal echogenicity. Portal vein is patent on color Doppler imaging with normal direction of blood flow towards the liver. Other: None. IMPRESSION: Cholelithiasis without evidence of cholecystitis. Electronically Signed   By: Minerva Fester M.D.   On: 07/11/2023 19:19   CT ABDOMEN PELVIS W CONTRAST  Result Date: 07/11/2023 CLINICAL DATA:  Epigastric pain and vomiting. Concern for pancreatitis EXAM: CT ABDOMEN AND PELVIS WITH CONTRAST TECHNIQUE: Multidetector CT imaging of the abdomen and pelvis was performed using the standard protocol following bolus administration of intravenous contrast. RADIATION DOSE REDUCTION: This exam was performed according to the departmental dose-optimization program which includes automated exposure control, adjustment of the mA and/or kV according to patient size and/or use of iterative reconstruction technique. CONTRAST:  OMNIPAQUE IOHEXOL 350 MG/ML SOLN COMPARISON:  CT 09/22/2021 FINDINGS: Lower chest: Lung bases are clear. Hepatobiliary: No focal hepatic lesion. No intrahepatic biliary duct dilatation. Gallbladder is distended at 51 mm. No radiodense gallstones. Common bile duct normal caliber. Pancreas: No pancreatic duct dilatation. No clear pancreatic inflammation. No peripancreatic fluid collections. Spleen: Normal spleen Adrenals/urinary tract: Adrenal glands and kidneys are normal. The ureters and bladder normal. Stomach/Bowel: Post bariatric  surgery Roux-en-Y anatomy. No evidence of bowel obstruction or inflammation. Appendix normal. The colon and rectosigmoid colon are normal. Vascular/Lymphatic: Abdominal aorta is normal caliber. There is no retroperitoneal or periportal lymphadenopathy. No pelvic lymphadenopathy. Reproductive: Post hysterectomy.  Adnexa unremarkable Other: No free fluid. Musculoskeletal: No aggressive osseous lesion. IMPRESSION: 1. Mild gallbladder distension. No radiodense gallstones or inflammation identified. 2. No evidence of acute pancreatitis. 3. Post bariatric surgery without complication. Electronically Signed   By: Genevive Bi M.D.   On: 07/11/2023 16:44    Assessment    Acute cholecystitis with cholelithiasis. Transaminitis Patient Active Problem List   Diagnosis Date Noted   Bipolar affective disorder, currently depressed, mild (HCC) 08/20/2019   Adenomyosis 01/02/2018   Family history of breast cancer 12/22/2017   Family history of ovarian cancer 12/22/2017   Continuous auditory hallucinations 07/16/2015   PTSD (post-traumatic stress disorder) 07/16/2015   Depression, major, recurrent, severe with psychosis (HCC) 07/16/2015   GAD (generalized anxiety disorder) 07/16/2015    Plan       XI ROBOTIC ASSISTED LAPAROSCOPIC CHOLECYSTECTOMY:    This was discussed thoroughly.  Optimal plan is for robotic cholecystectomy utilizing ICG imaging. Risks and benefits have been discussed with the patient which include but are not limited to anesthesia, bleeding, infection, biliary ductal injury, resulting in leak or stenosis, other associated unanticipated injuries affiliated with laparoscopic surgery.   Reviewed that removing the gallbladder will only address the symptoms related to the gallbladder itself.  I believe there is the desire to proceed, accepting the risks with understanding.  Questions elicited and answered to satisfaction.   No guarantees ever expressed or implied.  Face-to-face time spent  with the patient and accompanying care providers(if present) was 30 minutes, with more than 50% of the time spent counseling, educating, and coordinating care of the patient.    These notes generated with voice recognition software. I apologize for typographical errors.  Campbell Lerner M.D., FACS 07/11/2023, 9:43 PM

## 2023-07-11 NOTE — ED Triage Notes (Addendum)
Pt here with central cp that started 45 mins ago. Pt states pain is central and epigastric and does not radiate. Pt states pain is like ' someone is sitting on her chest'. Pt was nauseous when pain hit. Pt received 324 of aspirin and is not on blood thinners. Pt has hx of an ablation in 2023.

## 2023-07-11 NOTE — ED Notes (Signed)
Pt endorsing brief (less than 10 seconds) chest pain episode with 10/10 pain reported. Pain now decreasing as episode has stopped.

## 2023-07-11 NOTE — Anesthesia Procedure Notes (Signed)
Procedure Name: Intubation Date/Time: 07/11/2023 10:57 PM  Performed by: Karoline Caldwell, CRNAPre-anesthesia Checklist: Patient identified, Patient being monitored, Timeout performed, Emergency Drugs available and Suction available Patient Re-evaluated:Patient Re-evaluated prior to induction Oxygen Delivery Method: Circle system utilized Preoxygenation: Pre-oxygenation with 100% oxygen Induction Type: IV induction and Rapid sequence Laryngoscope Size: 3 and McGraph Grade View: Grade I Tube type: Oral Tube size: 6.5 mm Number of attempts: 1 Airway Equipment and Method: Stylet Placement Confirmation: ETT inserted through vocal cords under direct vision, positive ETCO2 and breath sounds checked- equal and bilateral Secured at: 0 cm Tube secured with: Tape Dental Injury: Teeth and Oropharynx as per pre-operative assessment

## 2023-07-11 NOTE — Anesthesia Preprocedure Evaluation (Signed)
Anesthesia Evaluation  Patient identified by MRN, date of birth, ID band Patient awake    Reviewed: Allergy & Precautions, NPO status , Patient's Chart, lab work & pertinent test results  History of Anesthesia Complications Negative for: history of anesthetic complications  Airway Mallampati: III  TM Distance: >3 FB Neck ROM: Full    Dental  (+) Edentulous Upper, Poor Dentition, Chipped   Pulmonary neg pulmonary ROS, neg sleep apnea, neg COPD, Patient abstained from smoking.Not current smoker, former smoker   Pulmonary exam normal breath sounds clear to auscultation       Cardiovascular Exercise Tolerance: Good METShypertension, Pt. on medications (-) CAD and (-) Past MI (-) dysrhythmias  Rhythm:Regular Rate:Normal - Systolic murmurs Hx SVT that she says resolved after bariatric surgery.  Patient initially presented with chest pain. Two serial EKGs and troponins were NSR / within normal limits. Found to have cholecystitis.    Neuro/Psych  Headaches PSYCHIATRIC DISORDERS Anxiety Depression Bipolar Disorder      GI/Hepatic ,neg GERD  ,,(+)     (-) substance abuse  S/p bariatric surgery   Endo/Other  diabetes  No longer on diabetes meds after bariatric surgery  Renal/GU negative Renal ROS     Musculoskeletal   Abdominal  (+) + obese Abdomen: tender.   Peds  Hematology   Anesthesia Other Findings Past Medical History: No date: Complication of anesthesia     Comment:  WOKE UP DURING SURGERY FOR BOTH SURGERIES No date: Depression No date: Diabetes mellitus, type II (HCC) No date: Family history of adverse reaction to anesthesia     Comment:  MOM-HARD TO WAKE UP No date: Fatigue No date: GERD (gastroesophageal reflux disease) No date: Headache 07/16/2015: Hypertension No date: Pneumonia     Comment:  BEGINNING OF FEB 2019-RESOLVED-PT STATES SHE GETS               PNEUMONIA YEARLY No date: Psychosis (HCC) No  date: PTSD (post-traumatic stress disorder)  Reproductive/Obstetrics                              Anesthesia Physical Anesthesia Plan  ASA: 2  Anesthesia Plan: General   Post-op Pain Management: Precedex   Induction: Intravenous and Rapid sequence  PONV Risk Score and Plan: 4 or greater and Ondansetron, Dexamethasone and Midazolam  Airway Management Planned: Oral ETT and Video Laryngoscope Planned  Additional Equipment: None  Intra-op Plan:   Post-operative Plan: Extubation in OR  Informed Consent: I have reviewed the patients History and Physical, chart, labs and discussed the procedure including the risks, benefits and alternatives for the proposed anesthesia with the patient or authorized representative who has indicated his/her understanding and acceptance.     Dental advisory given  Plan Discussed with: CRNA and Surgeon  Anesthesia Plan Comments: (Patient has been nauseated and vomiting, plan for RSI. NPO appropriate.  Discussed risks of anesthesia with patient, including PONV, sore throat, lip/dental/eye damage. Rare risks discussed as well, such as cardiorespiratory and neurological sequelae, and allergic reactions. Discussed the role of CRNA in patient's perioperative care. Patient understands.)         Anesthesia Quick Evaluation

## 2023-07-11 NOTE — ED Provider Notes (Signed)
[facility] Provider Note    Event Date/Time   First MD Initiated Contact with Patient 07/11/23 1204     (approximate)   History   Chest Pain   HPI Rebekah Jacobs is a 47 y.o. female presenting today for chest pain.  Patient states having midline chest and epigastric pain that felt like a burning sensation.  When this happened she felt nauseous as well as lightheaded.  Still notes feeling nauseous and having pain in her epigastric region at this time.  Otherwise denies fever, shortness of breath, vomiting, dysuria, pyuria, constipation.  Did note an episode of diarrhea today as well.  Patient has history of gastric bypass surgery and since that she reports that she is almost entirely off all of her previous medications.  Denies smoking or alcohol use.     Physical Exam   Triage Vital Signs: ED Triage Vitals  Encounter Vitals Group     BP 07/11/23 1206 123/60     Systolic BP Percentile --      Diastolic BP Percentile --      Pulse Rate 07/11/23 1205 75     Resp 07/11/23 1205 16     Temp 07/11/23 1205 97.7 F (36.5 C)     Temp Source 07/11/23 1205 Oral     SpO2 07/11/23 1205 97 %     Weight 07/11/23 1204 (!) 315 lb 14.7 oz (143.3 kg)     Height 07/11/23 1204 5\' 7"  (1.702 m)     Head Circumference --      Peak Flow --      Pain Score 07/11/23 1204 7     Pain Loc --      Pain Education --      Exclude from Growth Chart --     Most recent vital signs: Vitals:   07/11/23 1206 07/11/23 1605  BP: 123/60 122/68  Pulse:  89  Resp:  (!) 0  Temp:  97.6 F (36.4 C)  SpO2:  100%   Physical Exam: I have reviewed the vital signs and nursing notes. General: Awake, alert, no acute distress.  Nontoxic appearing. Head:  Atraumatic, normocephalic.   ENT:  EOM intact, PERRL. Oral mucosa is pink and moist with no lesions. Neck: Neck is supple with full range of motion, No meningeal signs. Cardiovascular:  RRR, No murmurs. Peripheral pulses palpable and  equal bilaterally. Respiratory:  Symmetrical chest wall expansion.  No rhonchi, rales, or wheezes.  Good air movement throughout.  No use of accessory muscles.   Musculoskeletal:  No cyanosis or edema. Moving extremities with full ROM Abdomen:  Soft, mild tenderness palpation epigastric region, nondistended. Neuro:  GCS 15, moving all four extremities, interacting appropriately. Speech clear. Psych:  Calm, appropriate.   Skin:  Warm, dry, no rash.     ED Results / Procedures / Treatments   Labs (all labs ordered are listed, but only abnormal results are displayed) Labs Reviewed  COMPREHENSIVE METABOLIC PANEL - Abnormal; Notable for the following components:      Result Value   Sodium 134 (*)    CO2 21 (*)    Calcium 8.0 (*)    Total Protein 5.4 (*)    Albumin 2.7 (*)    AST 344 (*)    ALT 192 (*)    Total Bilirubin 1.3 (*)    All other components within normal limits  MAGNESIUM - Abnormal; Notable for the following components:   Magnesium 1.4 (*)  All other components within normal limits  PHOSPHORUS - Abnormal; Notable for the following components:   Phosphorus 2.2 (*)    All other components within normal limits  CBC WITH DIFFERENTIAL/PLATELET  LIPASE, BLOOD  TROPONIN I (HIGH SENSITIVITY)  TROPONIN I (HIGH SENSITIVITY)     EKG My EKG interpretation: Rate of 73, normal sinus rhythm, normal axis.  No acute ST elevation or depression.   RADIOLOGY CT pending at time of signout   PROCEDURES:  Critical Care performed: No  Procedures   MEDICATIONS ORDERED IN ED: Medications  lactated ringers bolus 1,000 mL (0 mLs Intravenous Stopped 07/11/23 1516)  ondansetron (ZOFRAN) injection 4 mg (4 mg Intravenous Given 07/11/23 1239)  pantoprazole (PROTONIX) injection 40 mg (40 mg Intravenous Given 07/11/23 1239)  magnesium sulfate IVPB 2 g 50 mL (0 g Intravenous Stopped 07/11/23 1700)  iohexol (OMNIPAQUE) 350 MG/ML injection 100 mL (100 mLs Intravenous Contrast Given 07/11/23  1510)     IMPRESSION / MDM / ASSESSMENT AND PLAN / ED COURSE  I reviewed the triage vital signs and the nursing notes.                              Differential diagnosis includes, but is not limited to, ACS, pancreatitis, cholecystitis, he symptomatic cholelithiasis, choledocholithiasis, SBO.  Patient's presentation is most consistent with acute complicated illness / injury requiring diagnostic workup.  Patient is a 47 year old female presenting today for epigastric and chest pain with acute onset associated with nausea symptoms.  Vital signs stable on arrival.  EKG with no evidence of ischemia and initial troponin less than 2 with low suspicion for ACS.  Laboratory workup does show elevation in liver function tests as well as T. bili.  Patient given 1 L of fluids and Zofran with improvement in symptoms.  Patient was signed out to oncoming provider pending results of CT abdomen/pelvis.  The patient is on the cardiac monitor to evaluate for evidence of arrhythmia and/or significant heart rate changes. Clinical Course as of 07/11/23 1823  Mon Jul 11, 2023  1338 CBC with Differential Unremarkable [DW]  1416 Troponin I (High Sensitivity): <2 [DW]  1457 Comprehensive metabolic panel(!) New LFT elevations.  Will evaluate with CT abdomen/pelvis [DW]  1459 Magnesium(!): 1.4 [DW]  1459 Phosphorus(!): 2.2 [DW]    Clinical Course User Index [DW] Janith Lima, MD     FINAL CLINICAL IMPRESSION(S) / ED DIAGNOSES   Final diagnoses:  Epigastric abdominal pain  Nausea without vomiting  Elevated transaminase level     Rx / DC Orders   ED Discharge Orders     None        Note:  This document was prepared using Dragon voice recognition software and may include unintentional dictation errors.   Janith Lima, MD 07/11/23 [phone]

## 2023-07-12 ENCOUNTER — Other Ambulatory Visit: Payer: Self-pay

## 2023-07-12 DIAGNOSIS — K8000 Calculus of gallbladder with acute cholecystitis without obstruction: Secondary | ICD-10-CM

## 2023-07-12 DIAGNOSIS — R7401 Elevation of levels of liver transaminase levels: Secondary | ICD-10-CM

## 2023-07-12 MED ORDER — FENTANYL CITRATE (PF) 100 MCG/2ML IJ SOLN
INTRAMUSCULAR | Status: AC
  Filled 2023-07-12: qty 2

## 2023-07-12 MED ORDER — SODIUM CHLORIDE 0.9 % IV SOLN: INTRAVENOUS | Status: DC

## 2023-07-12 MED ORDER — ONDANSETRON HCL 4 MG/2ML IJ SOLN: 4.0000 mg | Freq: Four times a day (QID) | INTRAMUSCULAR | Status: DC | PRN

## 2023-07-12 MED ORDER — OXYCODONE-ACETAMINOPHEN 5-325 MG PO TABS: 1.0000 | ORAL_TABLET | Freq: Four times a day (QID) | ORAL | 0 refills | Status: DC | PRN

## 2023-07-12 MED ORDER — OXYCODONE HCL 5 MG PO TABS: 5.0000 mg | ORAL_TABLET | Freq: Once | ORAL | Status: DC | PRN

## 2023-07-12 MED ORDER — OXYCODONE HCL 5 MG/5ML PO SOLN: 5.0000 mg | Freq: Once | ORAL | Status: DC | PRN

## 2023-07-12 MED ORDER — DIPHENHYDRAMINE HCL 25 MG PO CAPS: 25.0000 mg | ORAL_CAPSULE | Freq: Four times a day (QID) | ORAL | Status: DC | PRN

## 2023-07-12 MED ORDER — IBUPROFEN 800 MG PO TABS: 800.0000 mg | ORAL_TABLET | Freq: Three times a day (TID) | ORAL | 0 refills | Status: AC | PRN

## 2023-07-12 MED ORDER — CEFAZOLIN SODIUM-DEXTROSE 2-4 GM/100ML-% IV SOLN: 2.0000 g | Freq: Three times a day (TID) | INTRAVENOUS | Status: DC

## 2023-07-12 MED ORDER — ONDANSETRON HCL 4 MG/2ML IJ SOLN: 4.0000 mg | Freq: Once | INTRAMUSCULAR | Status: DC | PRN

## 2023-07-12 MED ORDER — ZOLPIDEM TARTRATE 5 MG PO TABS: 5.0000 mg | ORAL_TABLET | Freq: Every evening | ORAL | Status: DC | PRN

## 2023-07-12 MED ORDER — HYDROMORPHONE HCL 1 MG/ML IJ SOLN: 1.0000 mg | INTRAMUSCULAR | Status: DC | PRN

## 2023-07-12 MED ORDER — DIPHENHYDRAMINE HCL 50 MG/ML IJ SOLN: 25.0000 mg | Freq: Four times a day (QID) | INTRAMUSCULAR | Status: DC | PRN

## 2023-07-12 MED ORDER — HYDRALAZINE HCL 20 MG/ML IJ SOLN: 10.0000 mg | INTRAMUSCULAR | Status: DC | PRN

## 2023-07-12 MED ORDER — OXYCODONE-ACETAMINOPHEN 5-325 MG PO TABS
1.0000 | ORAL_TABLET | ORAL | Status: DC | PRN
  Administered 2023-07-12 (×2): 1 via ORAL
  Filled 2023-07-12 (×2): qty 1

## 2023-07-12 MED ORDER — FENTANYL CITRATE (PF) 100 MCG/2ML IJ SOLN
25.0000 ug | INTRAMUSCULAR | Status: DC | PRN
  Administered 2023-07-12: 50 ug via INTRAVENOUS

## 2023-07-12 MED ORDER — ONDANSETRON 4 MG PO TBDP: 4.0000 mg | ORAL_TABLET | Freq: Four times a day (QID) | ORAL | Status: DC | PRN

## 2023-07-12 MED ORDER — CEFAZOLIN SODIUM-DEXTROSE 2-4 GM/100ML-% IV SOLN
2.0000 g | Freq: Three times a day (TID) | INTRAVENOUS | Status: AC
  Administered 2023-07-12: 2 g via INTRAVENOUS
  Filled 2023-07-12: qty 100

## 2023-07-12 NOTE — Transfer of Care (Signed)
Immediate Anesthesia Transfer of Care Note  Patient: Rebekah Jacobs  Procedure(s) Performed: XI ROBOTIC ASSISTED LAPAROSCOPIC CHOLECYSTECTOMY INDOCYANINE GREEN FLUORESCENCE IMAGING (ICG)  Patient Location: PACU  Anesthesia Type:General  Level of Consciousness: awake, alert , and oriented  Airway & Oxygen Therapy: Patient Spontanous Breathing  Post-op Assessment: Report given to RN and Post -op Vital signs reviewed and stable  Post vital signs: Reviewed and stable  Last Vitals:  Vitals Value Taken Time  BP 119/78 07/12/23 0011  Temp 36.7 C 07/12/23 0011  Pulse 94 07/12/23 0012  Resp 13 07/12/23 0012  SpO2 97 % 07/12/23 0012  Vitals shown include unfiled device data.  Last Pain:  Vitals:   07/12/23 0010  TempSrc:   PainSc: Asleep         Complications: No notable events documented.

## 2023-07-12 NOTE — Anesthesia Postprocedure Evaluation (Signed)
Anesthesia Post Note  Patient: Rebekah Jacobs  Procedure(s) Performed: XI ROBOTIC ASSISTED LAPAROSCOPIC CHOLECYSTECTOMY INDOCYANINE GREEN FLUORESCENCE IMAGING (ICG)  Patient location during evaluation: PACU Anesthesia Type: General Level of consciousness: awake and alert Pain management: pain level controlled Vital Signs Assessment: post-procedure vital signs reviewed and stable Respiratory status: spontaneous breathing, nonlabored ventilation, respiratory function stable and patient connected to nasal cannula oxygen Cardiovascular status: blood pressure returned to baseline and stable Postop Assessment: no apparent nausea or vomiting Anesthetic complications: no   No notable events documented.   Last Vitals:  Vitals:   07/12/23 0102 07/12/23 0221  BP: 108/78 111/67  Pulse: 86 77  Resp: 15 16  Temp: 36.8 C 37 C  SpO2: 100% 99%    Last Pain:  Vitals:   07/12/23 0221  TempSrc: Oral  PainSc:                  Corinda Gubler

## 2023-07-12 NOTE — TOC CM/SW Note (Signed)
Transition of Care [facility]) - Inpatient Brief Assessment   Patient Details  Name: TAIJAH COWINS MRN: [id number] Date of Birth: Dec 25, 1975  [facility] CM/SW Contact:    Margarito Liner, LCSW Phone Number: 07/12/2023, 12:30 PM   Clinical Narrative: Patient has orders to discharge home today. Chart reviewed. No TOC needs identified. CSW signing off.  Transition of Care Asessment: Insurance and Status: Insurance coverage has been reviewed Patient has primary care physician: Yes Home environment has been reviewed: Single family home Prior level of function:: Not documented Prior/Current Home Services: No current home services Social Determinants of Health Reivew: SDOH reviewed no interventions necessary Readmission risk has been reviewed: Yes Transition of care needs: no transition of care needs at this time

## 2023-07-12 NOTE — Discharge Instructions (Signed)

## 2023-07-12 NOTE — Plan of Care (Signed)

## 2023-07-12 NOTE — Discharge Summary (Signed)
[facility] SURGICAL DISCHARGE SUMMARY  Patient ID: Rebekah Jacobs MRN: [id number] DOB/AGE: [birth year] 47 y.o.  Admit date: 07/11/2023 Discharge date: 07/12/2023  Discharge Diagnoses Patient Active Problem List   Diagnosis Date Noted   Elevated transaminase level 07/12/2023   Acute cholecystitis due to biliary calculus 07/11/2023    Consultants None  Procedures 07/11/2023:  Robotic assisted laparoscopic cholecystectomy  HPI: Rebekah Jacobs is a 47 y.o. female with substernal chest pain, diaphoresis, nausea for few hours this morning.  Was having some right upper quadrant pain over the weekend.  This morning attempted to have a portion of a biscuit some eggs and sausage but it all came back up.  Attempted to carry on however got clammy, weak and feeling faint while attempting to shop.  Was brought to the ER where she underwent a workup including a CT scan, abdominal ultrasound, and lab work.  Elevated transaminases, a stone apparently in the neck of the gallbladder or causes of concern for acute symptomatic cholecystitis with cholelithiasis. 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  Hospital Course: Informed consent was obtained and documented, and patient underwent uneventful robotic assisted laparoscopic cholecystectomy (Dr Claudine Mouton, 07/11/2023).  Post-operatively, patient's symptoms improved/resolved and advancement of patient's diet and ambulation were well-tolerated. The remainder of patient's hospital course was essentially unremarkable, and discharge planning was initiated accordingly with patient safely able to be discharged home with appropriate discharge instructions, pain control, and outpatient follow-up after all of her questions were answered to her expressed satisfaction.   Discharge Condition: Good   Physical Examination:  Constitutional: Well appearing female, NAD Pulmonary:  Normal effort, no respiratory distress Gastrointestinal: Soft, incisional soreness; mild, non-distended, no rebound/guarding Skin: Laparoscopic incisions are CDI with dermabond, no erythema or drainage    Allergies as of 07/12/2023       Reactions   Shellfish Allergy Anaphylaxis   Peanuts [peanut Oil] Swelling        Medication List     TAKE these medications    albuterol 108 (90 Base) MCG/ACT inhaler Commonly known as: VENTOLIN HFA Inhale 2 puffs into the lungs every 4 (four) hours as needed for wheezing or shortness of breath.   gabapentin 300 MG capsule Commonly known as: NEURONTIN Take 300 mg by mouth 3 (three) times daily as needed.   glipiZIDE 5 MG tablet Commonly known as: GLUCOTROL Take 5 mg by mouth 2 (two) times daily before a meal.   guaiFENesin-codeine 100-10 MG/5ML syrup Commonly known as: ROBITUSSIN AC Take 5 mLs by mouth 3 (three) times daily as needed for cough.   hydrochlorothiazide 12.5 MG capsule Commonly known as: MICROZIDE Take 12.5 mg by mouth daily.   ibuprofen 800 MG tablet Commonly known as: ADVIL Take 1 tablet (800 mg total) by mouth every 8 (eight) hours as needed for moderate pain.   lisinopril 10 MG tablet Commonly known as: ZESTRIL Take 10 mg by mouth at bedtime.   lovastatin 20 MG tablet Commonly known as: MEVACOR Take 20 mg by mouth at bedtime.   metFORMIN 500 MG 24 hr tablet Commonly known as: GLUCOPHAGE-XR Take 500 mg by mouth at bedtime. At bedtime   montelukast 10 MG tablet Commonly known as: SINGULAIR Take 10 mg by mouth at bedtime.   naproxen 500 MG tablet Commonly known as: NAPROSYN Take 500 mg by mouth 2 (two) times daily as needed for mild pain.   oxyCODONE-acetaminophen 5-325  MG tablet Commonly known as: PERCOCET/ROXICET Take 1 tablet by mouth every 6 (six) hours as needed for severe pain.   potassium chloride SA 20 MEQ tablet Commonly known as: KLOR-CON M Take 1 tablet (20 mEq total) by mouth daily for 5  days.   promethazine 25 MG suppository Commonly known as: PHENERGAN Place 1 suppository (25 mg total) rectally every 6 (six) hours as needed for refractory nausea / vomiting.   QUEtiapine 100 MG tablet Commonly known as: SEROquel Take 1 tablet (100 mg total) by mouth at bedtime.   Rybelsus 14 MG Tabs Generic drug: Semaglutide Take 1 tablet by mouth daily.   tiZANidine 2 MG tablet Commonly known as: ZANAFLEX Take 2 mg by mouth.   venlafaxine XR 75 MG 24 hr capsule Commonly known as: Effexor XR Take 1 capsule (75 mg total) by mouth daily with breakfast.          Follow-up Information     Donovan Kail, PA-C. Schedule an appointment as soon as possible for a visit on 07/26/2023.   Specialty: Physician Assistant Contact information: [address] [place] [address] [phone]                  Time spent on discharge management including discussion of hospital course, clinical condition, outpatient instructions, prescriptions, and follow up with the patient and members of the medical team: >30 minutes  -- Lynden Oxford , PA-C [facility]  07/12/2023, 12:29 PM [phone] M-F: 7am - 4pm

## 2023-07-12 NOTE — Op Note (Signed)
Robotic cholecystectomy with Indocyamine Green Ductal Imaging.   Pre-operative Diagnosis: Acute calculus cholecystitis  Post-operative Diagnosis:  Same.  Procedure: Robotic assisted laparoscopic cholecystectomy with Indocyamine Green Ductal Imaging.   Surgeon: Campbell Lerner, M.D., FACS  Anesthesia: General. with endotracheal tube  Findings: Edematous interface of GB to fossa.   Estimated Blood Loss: 10 mL         Drains: None         Specimens: Gallbladder           Complications: none  Procedure Details  The patient was seen again in the Holding Room.  2.5 mg dose of ICG was administered intravenously.   The benefits, complications, treatment options, risks and expected outcomes were again reviewed with the patient. The likelihood of improving the patient's symptoms with return to their baseline status is good.  The patient and/or family concurred with the proposed plan, giving informed consent, again alternatives reviewed.  The patient was taken to Operating Room, identified, and the procedure verified as robotic assisted laparoscopic cholecystectomy.  Prior to the induction of general anesthesia, antibiotic prophylaxis was administered. VTE prophylaxis was in place. General endotracheal anesthesia was then administered and tolerated well. The patient was positioned in the supine position.  After the induction, the abdomen was prepped with Chloraprep and draped in the sterile fashion.  A Time Out was held and the above information confirmed.  After local infiltration of quarter percent Marcaine with epinephrine, stab incision was made left upper quadrant.  Just below the costal margin at Palmer's point, approximately midclavicular line the Veres needle is passed with sensation of the layers to penetrate the abdominal wall and into the peritoneum.  Saline drop test is confirmed peritoneal placement.  Insufflation is initiated with carbon dioxide to pressures of 15 mmHg.  Local  infiltration with a mixture of Exparel and 0.25% Marcaine with epinephrine is utilized for all skin incisions.  Made a 12 mm incision on the right periumbilical site, I advanced an optical 11mm port under direct visualization into the peritoneal cavity.  Once the peritoneum was penetrated, insufflation was initiated.  The trocar was then advanced into the abdominal cavity under direct visualization. Pneumoperitoneum was then continued utilizing CO2 at 15 mmHg or less and tolerated well without any adverse changes in the patient's vital signs.  Two 8.5-mm ports were placed in the left lower quadrant and laterally, and one to the right lower quadrant, all under direct vision. Local infiltration with a mixture of Exparel and 0.25% Marcaine with epinephrine is utilized for all port sites with deep infiltration under visualization.   The patient was positioned  in reverse Trendelenburg, tilted the patient's left side down.  Da Vinci XI robot was then positioned on to the patient's left side, and docked.  The gallbladder was identified, the fundus grasped via the arm 4 Prograsp and retracted cephalad. Adhesions were lysed with scissors and cautery.  The infundibulum was identified grasped and retracted laterally, exposing the peritoneum overlying the triangle of Calot. This was then opened and dissected using cautery & scissors. An extended critical view of the cystic duct and cystic artery was obtained, aided by the ICG via FireFly which improved localization of the ductal anatomy.    The cystic duct was clearly identified and dissected to isolation.   Artery well isolated and clipped, and the cystic duct was triple clipped and divided with scissors, as close to the gallbladder neck as feasible, thus leaving two on the remaining stump.  The specimen  side of the artery is sealed with bipolar and divided with monopolar scissors.   The gallbladder was taken from the gallbladder fossa in a retrograde fashion with the  electrocautery. The gallbladder was removed and placed in an Endocatch bag.  The liver bed is inspected. Hemostasis was confirmed.  The robot was undocked and moved away from the operative field. No irrigation was utilized.  Area aspirated clear.  The gallbladder and Endocatch sac were then removed through the paraumbilical port site.   Inspection of the right upper quadrant was performed. No bleeding, bile duct injury or leak, or bowel injury was noted. The infra-umbilical port site fascia was closed with interrumpted 0 Vicryl sutures using PMI/cone under direct visualization. Pneumoperitoneum was released and ports removed.  4-0 subcuticular Monocryl was used to close the skin. Dermabond was  applied.  The patient was then extubated and brought to the recovery room in stable condition. Sponge, lap, and needle counts were correct at closure and at the conclusion of the case.               Campbell Lerner, M.D., [facility] 07/12/2023 12:16 AM

## 2023-07-26 ENCOUNTER — Encounter: Payer: Self-pay | Admitting: Physician Assistant

## 2023-07-26 ENCOUNTER — Ambulatory Visit (INDEPENDENT_AMBULATORY_CARE_PROVIDER_SITE_OTHER): Payer: 59 | Admitting: Physician Assistant

## 2023-07-26 VITALS — BP 144/95 | HR 98 | Temp 98.5°F | Ht 67.0 in | Wt 224.4 lb

## 2023-07-26 DIAGNOSIS — K8000 Calculus of gallbladder with acute cholecystitis without obstruction: Secondary | ICD-10-CM

## 2023-07-26 DIAGNOSIS — Z09 Encounter for follow-up examination after completed treatment for conditions other than malignant neoplasm: Secondary | ICD-10-CM

## 2023-07-26 NOTE — Progress Notes (Signed)
[facility] POST-OP OFFICE VISIT  07/26/2023  HPI: Rebekah Jacobs is a 47 y.o. female 14 days s/p robotic assisted laparoscopic cholecystectomy for acute cholecystitis with Dr Claudine Mouton   Overall she is doing okay; does feel "better" than before surgery She is having incisional soreness primary at camera port with swelling; worse with movement No fever, chills Is having nausea, emesis, and diarrhea - trying to limit fatty foods No issues with incisions themselves  She is ambulating and trying to do her normal routine   Vital signs: BP (!) 144/95 (BP Location: Right Arm, Patient Position: Sitting, Cuff Size: Large)   Pulse 98   Temp 98.5 F (36.9 C) (Oral)   Ht 5\' 7"  (1.702 m)   Wt 224 lb 6.4 oz (101.8 kg)   LMP 12/29/2017 (Exact Date)   SpO2 97%   BMI 35.15 kg/m    Physical Exam: Constitutional: Well appearing female, NAD Abdomen: Soft, incisional soreness, no RUQ pain, non-distended, no rebound/guarding Skin: Laparoscopic incisions are healing well, no erythema or drainage. There is significant induration at camera port site; consistent with scarring from surgery, no evidence of hernia   Assessment/Plan: This is a 46 y.o. female 14 days s/p robotic assisted laparoscopic cholecystectomy for acute cholecystitis with Dr Claudine Mouton    - Encouraged continuing to limit fatty/greasy foods as her body is adjusting to not having a gallbladder. Suspect her symptoms are secondary to adjusting post-cholecystectomy although this is slightly more complex given her bypass  - Can trial anti-diarrheals +/- fiber supplementation for diarrhea  - Pain control prn; OTC modalities + ice packs  - Reviewed wound care recommendation  - Reviewed lifting restrictions; at least 4 weeks total  - Reviewed surgical pathology; CC  - She will follow up with Dr Claudine Mouton in 2-3 weeks. Given her history of bypass, I do feel this is slightly moe complex. She understands to call with  questions/concerns in the interim  -- Lynden Oxford, PA-C [facility] 07/26/2023, 2:36 PM M-F: 7am - 4pm

## 2023-07-26 NOTE — Patient Instructions (Signed)
How to Minimize Scarring After Surgery Scarring is a risk of any surgery that involves cutting the skin (making an incision). You can reduce scarring by following instructions from your health care provider for care at home after surgery. This includes keeping your incision clean, moist, and protected from the sun. What are the risks? Every person scars differently. Factors that affect how you scar include: Which surgery technique was used. Where the incision was made on your body. Your overall health. Your age. Your skin. Some medicines. How to minimize scarring after surgery Right after surgery     Follow instructions from your health care provider about how to take care of your incision. Make sure you: Wash your hands with soap and water for at least 20 seconds before and after you change your bandage (dressing). If soap and water are not available, use hand sanitizer. Change your dressing as told by your health care provider. Leave stitches (sutures), skin glue, or adhesive strips in place. These skin closures may need to be in place for 2 weeks or longer. If adhesive strip edges start to loosen and curl up, you may trim the loose edges. Do not remove adhesive strips completely unless your health care provider tells you to do that. Check your incision area every day for signs of infection. Check for: Redness, swelling, or pain. Fluid or blood. Warmth. Pus or a bad smell. If directed, apply antibiotic ointment or another ointment, such as petroleum jelly, to the incision to keep it moist until it heals fully. You may need to moisten two times per day for about 2 weeks. If you were prescribed antibiotic ointment, use it as told by your health care provider. Do not stop using the antibiotic even if your condition improves. Get sutures taken out at the scheduled time. Avoid touching your incision unless needed.  After your skin has healed Keep your scar protected from the sun. Cover the  scar with sunscreen that has an SPF of 30 or higher. Do not put sunscreen on your scar until it has healed. Gently massage the scar using a circular motion. This will help to minimize the appearance of the scar. Do this only after the incision has closed and all the sutures have been removed. Remember that the scar may appear lighter or darker than your normal skin color. This difference in color may even out with time. If your scar does not fade or go away with time and you do not like how it looks, consider talking with a plastic surgeon or a dermatologist. Follow these instructions at home: Do not use any products that contain nicotine or tobacco. These products include cigarettes, chewing tobacco, and vaping devices, such as e-cigarettes. These can delay healing. If you need help quitting, ask your health care provider. Follow all restrictions, such as limits on exercise or work. What you should do and should not do will depend on where your incision is located. Keep all follow-up visits. This is important to check on healing. Contact a health care provider if: Your sutures come out or adhesive strips come off before your health care provider said they would. You have a fever or chills. You have any signs of infection. You think that you are having a reaction to the antibiotic ointment. Get help right away if: You have bleeding from the incision that does not stop. Summary Scarring is a risk of any surgery that involves cutting the skin (making an incision). Follow instructions from your health care  provider about how to take care of your incision to help it heal and to minimize scarring. Keep your incision clean. If directed, apply antibiotic ointment or another ointment, such as petroleum jelly, to the incision. Keep your scar protected from the sun. This information is not intended to replace advice given to you by your health care provider. Make sure you discuss any questions you have  with your health care provider. Document Revised: 02/18/2022 Document Reviewed: 02/18/2022 Elsevier Patient Education  2024 Elsevier Inc.   GENERAL POST-OPERATIVE PATIENT INSTRUCTIONS   WOUND CARE INSTRUCTIONS:  Keep a dry clean dressing on the wound if there is drainage. The initial bandage may be removed after 24 hours.  Once the wound has quit draining you may leave it open to air.  If clothing rubs against the wound or causes irritation and the wound is not draining you may cover it with a dry dressing during the daytime.  Try to keep the wound dry and avoid ointments on the wound unless directed to do so.  If the wound becomes bright red and painful or starts to drain infected material that is not clear, please contact your physician immediately.  If the wound is mildly pink and has a thick firm ridge underneath it, this is normal, and is referred to as a healing ridge.  This will resolve over the next 4-6 weeks.  BATHING: You may shower if you have been informed of this by your surgeon. However, Please do not submerge in a tub, hot tub, or pool until incisions are completely sealed or have been told by your surgeon that you may do so.  DIET:  You may eat any foods that you can tolerate.  It is a good idea to eat a high fiber diet and take in plenty of fluids to prevent constipation.  If you do become constipated you may want to take a mild laxative or take ducolax tablets on a daily basis until your bowel habits are regular.  Constipation can be very uncomfortable, along with straining, after recent surgery.  ACTIVITY:  You are encouraged to cough and deep breath or use your incentive spirometer if you were given one, every 15-30 minutes when awake.  This will help prevent respiratory complications and low grade fevers post-operatively if you had a general anesthetic.  You may want to hug a pillow when coughing and sneezing to add additional support to the surgical area, if you had abdominal or  chest surgery, which will decrease pain during these times.  You are encouraged to walk and engage in light activity for the next two weeks.  You should not lift more than 20 pounds for 6 weeks total after surgery as it could put you at increased risk for complications.  Twenty pounds is roughly equivalent to a plastic bag of groceries. At that time- Listen to your body when lifting, if you have pain when lifting, stop and then try again in a few days. Soreness after doing exercises or activities of daily living is normal as you get back in to your normal routine.  MEDICATIONS:  Try to take narcotic medications and anti-inflammatory medications, such as tylenol, ibuprofen, naprosyn, etc., with food.  This will minimize stomach upset from the medication.  Should you develop nausea and vomiting from the pain medication, or develop a rash, please discontinue the medication and contact your physician.  You should not drive, make important decisions, or operate machinery when taking narcotic pain medication.  SUNBLOCK Use  sun block to incision area over the next year if this area will be exposed to sun. This helps decrease scarring and will allow you avoid a permanent darkened area over your incision.  QUESTIONS:  Please feel free to call our office if you have any questions, and we will be glad to assist you. [phone]

## 2023-08-09 ENCOUNTER — Encounter: Payer: Self-pay | Admitting: Surgery

## 2023-08-09 ENCOUNTER — Ambulatory Visit (INDEPENDENT_AMBULATORY_CARE_PROVIDER_SITE_OTHER): Payer: 59 | Admitting: Surgery

## 2023-08-09 VITALS — BP 121/86 | HR 76 | Temp 98.2°F | Ht 67.0 in | Wt 220.0 lb

## 2023-08-09 DIAGNOSIS — K8000 Calculus of gallbladder with acute cholecystitis without obstruction: Secondary | ICD-10-CM

## 2023-08-09 DIAGNOSIS — Z9049 Acquired absence of other specified parts of digestive tract: Secondary | ICD-10-CM | POA: Insufficient documentation

## 2023-08-09 DIAGNOSIS — R12 Heartburn: Secondary | ICD-10-CM | POA: Insufficient documentation

## 2023-08-09 DIAGNOSIS — Z09 Encounter for follow-up examination after completed treatment for conditions other than malignant neoplasm: Secondary | ICD-10-CM

## 2023-08-09 MED ORDER — PANTOPRAZOLE SODIUM 20 MG PO TBEC: 20.0000 mg | DELAYED_RELEASE_TABLET | Freq: Every day | ORAL | 2 refills | Status: AC

## 2023-08-09 NOTE — Progress Notes (Signed)
[facility] POST-OP OFFICE VISIT  08/09/2023  HPI: Rebekah Jacobs is a 47 y.o. female s/p robotic cholecystectomy on July 11, 2023.  Reports has intermittent upper abdominal pressure with gas, increased pyrosis, would like to consider getting back on her PPI.  She reports still having loose stools but this has been persistent secondary to her gastric bypass.  She otherwise denies nausea, vomiting, fevers or chills.  Vital signs: BP 121/86   Pulse 76   Temp 98.2 F (36.8 C)   Ht 5\' 7"  (1.702 m)   Wt 220 lb (99.8 kg)   LMP 12/29/2017 (Exact Date)   SpO2 98%   BMI 34.46 kg/m    Physical Exam: Constitutional: She appears well, Abdomen: Soft nontender.  Expected thickening at extraction site. Skin: All well-healed.  Assessment/Plan: This is a 47 y.o. female 30+ days s/p robotic cholecystectomy.  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  And will be glad to follow her up as needed should she continue to have pyrosis/GERD symptoms following this course.  Aside from postoperative follow-up for her gallbladder I believe she may follow-up as needed.   Campbell Lerner M.D., FACS 08/09/2023, 9:08 AM

## 2023-08-09 NOTE — Patient Instructions (Addendum)
You are ok to start exercising. Just start out slow. Ok to restart your Protonix if you need it. Try it for 2 months and then try to wean yourself off of it.   Follow-up with our office as needed.  Please call and ask to speak with a nurse if you develop questions or concerns.   GENERAL POST-OPERATIVE PATIENT INSTRUCTIONS   WOUND CARE INSTRUCTIONS:  Keep a dry clean dressing on the wound if there is drainage. The initial bandage may be removed after 24 hours.  Once the wound has quit draining you may leave it open to air.  If clothing rubs against the wound or causes irritation and the wound is not draining you may cover it with a dry dressing during the daytime.  Try to keep the wound dry and avoid ointments on the wound unless directed to do so.  If the wound becomes bright red and painful or starts to drain infected material that is not clear, please contact your physician immediately.  If the wound is mildly pink and has a thick firm ridge underneath it, this is normal, and is referred to as a healing ridge.  This will resolve over the next 4-6 weeks.  BATHING: You may shower if you have been informed of this by your surgeon. However, Please do not submerge in a tub, hot tub, or pool until incisions are completely sealed or have been told by your surgeon that you may do so.  DIET:  You may eat any foods that you can tolerate.  It is a good idea to eat a high fiber diet and take in plenty of fluids to prevent constipation.  If you do become constipated you may want to take a mild laxative or take ducolax tablets on a daily basis until your bowel habits are regular.  Constipation can be very uncomfortable, along with straining, after recent surgery.  ACTIVITY:  You are encouraged to cough and deep breath or use your incentive spirometer if you were given one, every 15-30 minutes when awake.  This will help prevent respiratory complications and low grade fevers post-operatively if you had a general  anesthetic.  You may want to hug a pillow when coughing and sneezing to add additional support to the surgical area, if you had abdominal or chest surgery, which will decrease pain during these times.  You are encouraged to walk and engage in light activity for the next two weeks.  You should not lift more than 20 pounds for 6 weeks total after surgery as it could put you at increased risk for complications.  Twenty pounds is roughly equivalent to a plastic bag of groceries. At that time- Listen to your body when lifting, if you have pain when lifting, stop and then try again in a few days. Soreness after doing exercises or activities of daily living is normal as you get back in to your normal routine.  MEDICATIONS:  Try to take narcotic medications and anti-inflammatory medications, such as tylenol, ibuprofen, naprosyn, etc., with food.  This will minimize stomach upset from the medication.  Should you develop nausea and vomiting from the pain medication, or develop a rash, please discontinue the medication and contact your physician.  You should not drive, make important decisions, or operate machinery when taking narcotic pain medication.  SUNBLOCK Use sun block to incision area over the next year if this area will be exposed to sun. This helps decrease scarring and will allow you avoid a permanent darkened  area over your incision.  QUESTIONS:  Please feel free to call our office if you have any questions, and we will be glad to assist you. [phone]

## 2024-01-27 ENCOUNTER — Other Ambulatory Visit: Payer: Self-pay

## 2024-01-27 ENCOUNTER — Emergency Department
Admission: EM | Admit: 2024-01-27 | Discharge: 2024-01-27 | Disposition: A | Discharge status: Home / Self Care | Attending: Emergency Medicine | Admitting: Emergency Medicine

## 2024-01-27 ENCOUNTER — Encounter: Payer: Self-pay | Admitting: Emergency Medicine

## 2024-01-27 DIAGNOSIS — R1110 Vomiting, unspecified: Secondary | ICD-10-CM | POA: Diagnosis present

## 2024-01-27 DIAGNOSIS — I10 Essential (primary) hypertension: Secondary | ICD-10-CM | POA: Insufficient documentation

## 2024-01-27 DIAGNOSIS — E119 Type 2 diabetes mellitus without complications: Secondary | ICD-10-CM | POA: Insufficient documentation

## 2024-01-27 DIAGNOSIS — K529 Noninfective gastroenteritis and colitis, unspecified: Secondary | ICD-10-CM | POA: Insufficient documentation

## 2024-01-27 LAB — COMPREHENSIVE METABOLIC PANEL
ALT: 34 U/L (ref 0–44)
AST: 24 U/L (ref 15–41)
Albumin: 3.7 g/dL (ref 3.5–5.0)
Alkaline Phosphatase: 63 U/L (ref 38–126)
Anion gap: 10 (ref 5–15)
BUN: 16 mg/dL (ref 6–20)
CO2: 22 mmol/L (ref 22–32)
Calcium: 9.5 mg/dL (ref 8.9–10.3)
Chloride: 108 mmol/L (ref 98–111)
Creatinine, Ser: 0.56 mg/dL (ref 0.44–1.00)
GFR, Estimated: >60 mL/min (ref >60)
Glucose, Bld: 80 mg/dL (ref 70–99)
Potassium: 4.1 mmol/L (ref 3.5–5.1)
Sodium: 140 mmol/L (ref 135–145)
Total Bilirubin: 0.9 mg/dL (ref 0.0–1.2)
Total Protein: 7.2 g/dL (ref 6.5–8.1)

## 2024-01-27 LAB — URINALYSIS, ROUTINE W REFLEX MICROSCOPIC
Bilirubin Urine: NEGATIVE
Glucose, UA: NEGATIVE mg/dL
Hgb urine dipstick: NEGATIVE
Ketones, ur: NEGATIVE mg/dL
Leukocytes,Ua: NEGATIVE
Nitrite: NEGATIVE
Protein, ur: NEGATIVE mg/dL
Specific Gravity, Urine: 1.025 (ref 1.005–1.030)
pH: 6 (ref 5.0–8.0)

## 2024-01-27 LAB — CBC
HCT: 43.8 % (ref 36.0–46.0)
Hemoglobin: 14.1 g/dL (ref 12.0–15.0)
MCH: 28.8 pg (ref 26.0–34.0)
MCHC: 32.2 g/dL (ref 30.0–36.0)
MCV: 89.4 fL (ref 80.0–100.0)
Platelets: 230 10*3/uL (ref 150–400)
RBC: 4.9 MIL/uL (ref 3.87–5.11)
RDW: 13.4 % (ref 11.5–15.5)
WBC: 2.9 10*3/uL — ABNORMAL LOW (ref 4.0–10.5)
nRBC: 0 % (ref 0.0–0.2)

## 2024-01-27 LAB — LIPASE, BLOOD: Lipase: 27 U/L (ref 11–51)

## 2024-01-27 MED ORDER — PANTOPRAZOLE SODIUM 40 MG IV SOLR
40.0000 mg | Freq: Once | INTRAVENOUS | Status: AC
  Administered 2024-01-27: 40 mg via INTRAVENOUS
  Filled 2024-01-27: qty 10

## 2024-01-27 MED ORDER — SODIUM CHLORIDE 0.9 % IV BOLUS
1000.0000 mL | Freq: Once | INTRAVENOUS | Status: AC
  Administered 2024-01-27: 1000 mL via INTRAVENOUS

## 2024-01-27 MED ORDER — ONDANSETRON HCL 4 MG/2ML IJ SOLN
4.0000 mg | Freq: Once | INTRAMUSCULAR | Status: AC
  Administered 2024-01-27: 4 mg via INTRAVENOUS
  Filled 2024-01-27: qty 2

## 2024-01-27 MED ORDER — ONDANSETRON 4 MG PO TBDP: 4.0000 mg | ORAL_TABLET | Freq: Three times a day (TID) | ORAL | 0 refills | Status: DC | PRN

## 2024-01-27 MED ORDER — FAMOTIDINE 20 MG PO TABS: 20.0000 mg | ORAL_TABLET | Freq: Two times a day (BID) | ORAL | 0 refills | Status: AC

## 2024-01-27 MED ORDER — KETOROLAC TROMETHAMINE 15 MG/ML IJ SOLN
15.0000 mg | Freq: Once | INTRAMUSCULAR | Status: AC
  Administered 2024-01-27: 15 mg via INTRAVENOUS
  Filled 2024-01-27: qty 1

## 2024-01-27 NOTE — Discharge Instructions (Addendum)
 Take Zofran and Pepcid for stomach upset and nausea. You can also take loperamide 4mg  every 6 hours to reduce diarrhea.

## 2024-01-27 NOTE — ED Notes (Signed)
 Pt given water fro PO challenge.

## 2024-01-27 NOTE — ED Notes (Signed)
 IV team at bedside

## 2024-01-27 NOTE — ED Provider Notes (Signed)
 [facility] Provider Note    Event Date/Time   First MD Initiated Contact with Patient 01/27/24 [phone]     (approximate)   History   Chief Complaint: Emesis   HPI  Rebekah Jacobs is a 48 y.o. female with a history of diabetes, hypertension, GERD, bipolar disorder who comes ED complaining of generalized abdominal discomfort, vomiting and diarrhea for the past 5 days.  During this time has had dizziness with standing, 1 episode of syncope.  Reports decreased appetite and oral intake but she is drinking water.  No chest pain or shortness of breath, no trauma          Physical Exam   Triage Vital Signs: ED Triage Vitals [01/27/24 0927]  Encounter Vitals Group     BP 139/85     Systolic BP Percentile      Diastolic BP Percentile      Pulse Rate 85     Resp 17     Temp 97.9 F (36.6 C)     Temp Source Oral     SpO2 97 %     Weight 198 lb (89.8 kg)     Height 5\' 7"  (1.702 m)     Head Circumference      Peak Flow      Pain Score 0     Pain Loc      Pain Education      Exclude from Growth Chart     Most recent vital signs: Vitals:   01/27/24 0927  BP: 139/85  Pulse: 85  Resp: 17  Temp: 97.9 F (36.6 C)  SpO2: 97%    General: Awake, no distress.  CV:  Good peripheral perfusion.  Regular rate rhythm, Resp:  Normal effort.  Clear to auscultation bilaterally Abd:  No distention.  Soft, mild left upper quadrant tenderness Other:  Somewhat dry oral mucosa   ED Results / Procedures / Treatments   Labs (all labs ordered are listed, but only abnormal results are displayed) Labs Reviewed  CBC - Abnormal; Notable for the following components:      Result Value   WBC 2.9 (*)    All other components within normal limits  URINALYSIS, ROUTINE W REFLEX MICROSCOPIC - Abnormal; Notable for the following components:   Color, Urine YELLOW (*)    APPearance CLEAR (*)    All other components within normal limits  LIPASE, BLOOD  COMPREHENSIVE  METABOLIC PANEL     EKG    RADIOLOGY    PROCEDURES:  Procedures   MEDICATIONS ORDERED IN ED: Medications  sodium chloride 0.9 % bolus 1,000 mL (1,000 mLs Intravenous New Bag/Given 01/27/24 1058)  ondansetron (ZOFRAN) injection 4 mg (4 mg Intravenous Given 01/27/24 1057)  pantoprazole (PROTONIX) injection 40 mg (40 mg Intravenous Given 01/27/24 1052)  ketorolac (TORADOL) 15 MG/ML injection 15 mg (15 mg Intravenous Given 01/27/24 1055)     IMPRESSION / MDM / ASSESSMENT AND PLAN / ED COURSE  I reviewed the triage vital signs and the nursing notes.  DDx: Influenza-like illness, anemia, electrolyte derangement, AKI, dehydration, pancreatitis  Patient's presentation is most consistent with acute presentation with potential threat to life or bodily function.  Patient presents with GI symptoms for the last several days.  Exam suggestive of pancreatitis or gastritis, suspect influenza-like illness.  Doubt bowel obstruction, bowel perforation, biliary disease.  She is status post gastric bypass and cholecystectomy.  ----------------------------------------- 11:45 AM on 01/27/2024 ----------------------------------------- Feeling better. Labs normal. Tolerating PO. Stable  for DC.      FINAL CLINICAL IMPRESSION(S) / ED DIAGNOSES   Final diagnoses:  Gastroenteritis     Rx / DC Orders   ED Discharge Orders          Ordered    ondansetron (ZOFRAN-ODT) 4 MG disintegrating tablet  Every 8 hours PRN        01/27/24 1145    famotidine (PEPCID) 20 MG tablet  2 times daily        01/27/24 1145             Note:  This document was prepared using Dragon voice recognition software and may include unintentional dictation errors.   Sharman Cheek, MD 01/27/24 1145

## 2024-01-27 NOTE — ED Triage Notes (Signed)
 Patient to ED via POV for vomiting and diarrhea since Sunday. Hx of gastric bypass 1 years ago. NAD noted.

## 2024-01-31 ENCOUNTER — Other Ambulatory Visit: Payer: Self-pay | Admitting: Family Medicine

## 2024-01-31 DIAGNOSIS — M5416 Radiculopathy, lumbar region: Secondary | ICD-10-CM

## 2024-02-07 ENCOUNTER — Ambulatory Visit
Admission: RE | Admit: 2024-02-07 | Discharge: 2024-02-07 | Disposition: A | Discharge status: Home / Self Care | Source: Ambulatory Visit | Attending: Family Medicine | Admitting: Family Medicine

## 2024-02-07 DIAGNOSIS — M5416 Radiculopathy, lumbar region: Secondary | ICD-10-CM

## 2024-03-01 ENCOUNTER — Emergency Department
Admission: EM | Admit: 2024-03-01 | Discharge: 2024-03-01 | Disposition: A | Discharge status: Home / Self Care | Attending: Emergency Medicine | Admitting: Emergency Medicine

## 2024-03-01 ENCOUNTER — Encounter: Payer: Self-pay | Admitting: Emergency Medicine

## 2024-03-01 ENCOUNTER — Emergency Department

## 2024-03-01 ENCOUNTER — Other Ambulatory Visit: Payer: Self-pay

## 2024-03-01 DIAGNOSIS — J069 Acute upper respiratory infection, unspecified: Secondary | ICD-10-CM | POA: Diagnosis not present

## 2024-03-01 DIAGNOSIS — E119 Type 2 diabetes mellitus without complications: Secondary | ICD-10-CM | POA: Diagnosis not present

## 2024-03-01 DIAGNOSIS — Z7984 Long term (current) use of oral hypoglycemic drugs: Secondary | ICD-10-CM | POA: Insufficient documentation

## 2024-03-01 DIAGNOSIS — R059 Cough, unspecified: Secondary | ICD-10-CM | POA: Diagnosis present

## 2024-03-01 DIAGNOSIS — Z79899 Other long term (current) drug therapy: Secondary | ICD-10-CM | POA: Insufficient documentation

## 2024-03-01 DIAGNOSIS — I10 Essential (primary) hypertension: Secondary | ICD-10-CM | POA: Insufficient documentation

## 2024-03-01 LAB — RESP PANEL BY RT-PCR (RSV, FLU A&B, COVID)  RVPGX2
Influenza A by PCR: NEGATIVE
Influenza B by PCR: NEGATIVE
Resp Syncytial Virus by PCR: NEGATIVE
SARS Coronavirus 2 by RT PCR: NEGATIVE

## 2024-03-01 MED ORDER — ONDANSETRON 4 MG PO TBDP
4.0000 mg | ORAL_TABLET | Freq: Once | ORAL | Status: AC
  Administered 2024-03-01: 4 mg via ORAL
  Filled 2024-03-01: qty 1

## 2024-03-01 MED ORDER — HYDROCOD POLI-CHLORPHE POLI ER 10-8 MG/5ML PO SUER
5.0000 mL | Freq: Once | ORAL | Status: AC
  Administered 2024-03-01: 5 mL via ORAL
  Filled 2024-03-01: qty 5

## 2024-03-01 MED ORDER — HYDROCOD POLI-CHLORPHE POLI ER 10-8 MG/5ML PO SUER: 5.0000 mL | Freq: Two times a day (BID) | ORAL | 0 refills | Status: AC | PRN

## 2024-03-01 MED ORDER — ONDANSETRON 4 MG PO TBDP: 4.0000 mg | ORAL_TABLET | Freq: Four times a day (QID) | ORAL | 0 refills | Status: AC | PRN

## 2024-03-01 NOTE — Discharge Instructions (Signed)

## 2024-03-01 NOTE — ED Triage Notes (Signed)
 Patient ambulatory to triage with steady gait, without difficulty or distress noted; Pt reports prod cough yellow sputum  and sinus drainage x 2 days; taking sudafed and nyquil without relief of symptoms; denies fever

## 2024-03-01 NOTE — ED Provider Notes (Signed)
 [facility] Provider Note    Event Date/Time   First MD Initiated Contact with Patient 03/01/24 0254     (approximate)   History   Cough   HPI  Rebekah Jacobs is a 48 y.o. female with history of hypertension, diabetes, depression, PTSD who presents to the emergency department for 4 to 5 days of cough, congestion, hoarse voice.  Reports she does have seasonal allergies and takes antihistamines daily.  States that she has Flonase for home but does not take it because it raises her blood pressure.  She was concerned today that she could have pneumonia.  No known fevers.  Does have some chest discomfort with coughing.  No shortness of breath.   History provided by patient, family.    Past Medical History:  Diagnosis Date   Acute cholecystitis due to biliary calculus 07/11/2023   Complication of anesthesia    WOKE UP DURING SURGERY FOR BOTH SURGERIES   Depression    Diabetes mellitus, type II (HCC)    Family history of adverse reaction to anesthesia    MOM-HARD TO WAKE UP   Fatigue    GERD (gastroesophageal reflux disease)    Headache    Hypertension 07/16/2015   Pneumonia    BEGINNING OF FEB 2019-RESOLVED-PT STATES SHE GETS PNEUMONIA YEARLY   Psychosis (HCC)    PTSD (post-traumatic stress disorder)     Past Surgical History:  Procedure Laterality Date   BREAST REDUCTION SURGERY     COLONOSCOPY WITH PROPOFOL  N/A 11/11/2021   Procedure: COLONOSCOPY WITH PROPOFOL ;  Surgeon: Toledo, Alphonsus Jeans, MD;  Location: [facility] ENDOSCOPY;  Service: Gastroenterology;  Laterality: N/A;  DM   CYSTOSCOPY  01/17/2018   Procedure: CYSTOSCOPY;  Surgeon: Alben Alma, MD;  Location: [facility] ORS;  Service: Gynecology;;   LAPAROSCOPIC HYSTERECTOMY Bilateral 01/17/2018   Procedure: HYSTERECTOMY TOTAL LAPAROSCOPIC WITH BILATERAL SALPINGECTOMY;  Surgeon: Alben Alma, MD;  Location: [facility] ORS;  Service: Gynecology;  Laterality: Bilateral;   NOVASURE ABLATION     will be  done 11/17/21   REDUCTION MAMMAPLASTY Bilateral 2005    MEDICATIONS:  Prior to Admission medications   Medication Sig Start Date End Date Taking? Authorizing Provider  chlorpheniramine-HYDROcodone (TUSSIONEX) 10-8 MG/5ML Take 5 mLs by mouth every 12 (twelve) hours as needed. 03/01/24  Yes Joani Cosma, Clover Dao, DO  ondansetron  (ZOFRAN -ODT) 4 MG disintegrating tablet Take 1 tablet (4 mg total) by mouth every 6 (six) hours as needed for nausea or vomiting. 03/01/24  Yes Jil Penland, Starling Eck N, DO  albuterol  (VENTOLIN  HFA) 108 (90 Base) MCG/ACT inhaler Inhale 2 puffs into the lungs every 4 (four) hours as needed for wheezing or shortness of breath. 01/08/22   Triplett, Cari B, FNP  famotidine  (PEPCID ) 20 MG tablet Take 1 tablet (20 mg total) by mouth 2 (two) times daily. 01/27/24   Jacquie Maudlin, MD  gabapentin (NEURONTIN) 300 MG capsule Take 300 mg by mouth 3 (three) times daily as needed.  04/29/15   [provider]  glipiZIDE  (GLUCOTROL ) 5 MG tablet Take 5 mg by mouth 2 (two) times daily before a meal.    [provider]  guaiFENesin -codeine  (ROBITUSSIN AC) 100-10 MG/5ML syrup Take 5 mLs by mouth 3 (three) times daily as needed for cough. 01/08/22   Triplett, Cari B, FNP  hydrochlorothiazide (MICROZIDE) 12.5 MG capsule Take 12.5 mg by mouth daily.    [provider]  ibuprofen  (ADVIL ) 800 MG tablet Take 1 tablet (800 mg total) by mouth every  8 (eight) hours as needed for moderate pain. 07/12/23   Schulz, Zachary R, PA-C  lisinopril  (PRINIVIL ,ZESTRIL ) 10 MG tablet Take 10 mg by mouth at bedtime.    [provider]  lovastatin (MEVACOR) 20 MG tablet Take 20 mg by mouth at bedtime. 04/14/15   [provider]  metFORMIN  (GLUCOPHAGE -XR) 500 MG 24 hr tablet Take 500 mg by mouth at bedtime. At bedtime 08/27/14   [provider]  montelukast  (SINGULAIR ) 10 MG tablet Take 10 mg by mouth at bedtime.    [provider]  naproxen (NAPROSYN) 500 MG tablet Take 500 mg  by mouth 2 (two) times daily as needed for mild pain. 07/25/14   [provider]  pantoprazole  (PROTONIX ) 20 MG tablet Take 1 tablet (20 mg total) by mouth daily. 08/09/23   Flynn Hylan, MD  potassium chloride  SA (KLOR-CON ) 20 MEQ tablet Take 1 tablet (20 mEq total) by mouth daily for 5 days. 09/22/21 09/27/21  Loman Risk, MD  promethazine  (PHENERGAN ) 25 MG suppository Place 1 suppository (25 mg total) rectally every 6 (six) hours as needed for refractory nausea / vomiting. 09/22/21   Loman Risk, MD  QUEtiapine  (SEROQUEL ) 100 MG tablet Take 1 tablet (100 mg total) by mouth at bedtime. 09/17/19   Herby Lolling, MD  Semaglutide (RYBELSUS) 14 MG TABS Take 1 tablet by mouth daily.    [provider]  tiZANidine (ZANAFLEX) 2 MG tablet Take 2 mg by mouth.    [provider]  venlafaxine  XR (EFFEXOR  XR) 75 MG 24 hr capsule Take 1 capsule (75 mg total) by mouth daily with breakfast. 09/17/19   Herby Lolling, MD    Physical Exam   Triage Vital Signs: ED Triage Vitals  Encounter Vitals Group     BP 03/01/24 0233 (!) 141/90     Systolic BP Percentile --      Diastolic BP Percentile --      Pulse Rate 03/01/24 0233 79     Resp 03/01/24 0233 18     Temp 03/01/24 0233 98.4 F (36.9 C)     Temp Source 03/01/24 0233 Oral     SpO2 03/01/24 0233 99 %     Weight 03/01/24 0231 220 lb (99.8 kg)     Height 03/01/24 0231 5\' 7"  (1.702 m)     Head Circumference --      Peak Flow --      Pain Score 03/01/24 0234 0     Pain Loc --      Pain Education --      Exclude from Growth Chart --     Most recent vital signs: Vitals:   03/01/24 0233  BP: (!) 141/90  Pulse: 79  Resp: 18  Temp: 98.4 F (36.9 C)  SpO2: 99%    CONSTITUTIONAL: Alert, responds appropriately to questions. Well-appearing; well-nourished HEAD: Normocephalic, atraumatic EYES: Conjunctivae clear, pupils appear equal, sclera nonicteric ENT: normal nose; moist mucous membranes, voice is hoarse but  no muffled voice, airway patent, no stridor, trismus or drooling.  No angioedema.  No tonsillar hypertrophy or exudate.  No uvular deviation. NECK: Supple, normal ROM CARD: RRR; S1 and S2 appreciated RESP: Normal chest excursion without splinting or tachypnea; breath sounds clear and equal bilaterally; no wheezes, no rhonchi, no rales, no hypoxia or respiratory distress, speaking full sentences ABD/GI: Non-distended; soft, non-tender, no rebound, no guarding, no peritoneal signs BACK: The back appears normal EXT: Normal ROM in all joints; no deformity noted, no edema SKIN: Normal  color for age and race; warm; no rash on exposed skin NEURO: Moves all extremities equally, normal speech PSYCH: The patient's mood and manner are appropriate.   ED Results / Procedures / Treatments   LABS: (all labs ordered are listed, but only abnormal results are displayed) Labs Reviewed  RESP PANEL BY RT-PCR (RSV, FLU A&B, COVID)  [id number]     EKG:  EKG Interpretation Date/Time:    Ventricular Rate:    PR Interval:    QRS Duration:    QT Interval:    QTC Calculation:   R Axis:      Text Interpretation:           RADIOLOGY: My personal review and interpretation of imaging: Chest x-ray clear.  I have personally reviewed all radiology reports.   DG Chest 2 View Result Date: 03/01/2024 CLINICAL DATA:  Productive cough. EXAM: CHEST - 2 VIEW COMPARISON:  January 08, 2022 FINDINGS: The heart size and mediastinal contours are within normal limits. Both lungs are clear. The visualized skeletal structures are unremarkable. IMPRESSION: No active cardiopulmonary disease. Electronically Signed   By: Virgle Grime M.D.   On: 03/01/2024 03:10     PROCEDURES:  Critical Care performed: No    Procedures    IMPRESSION / MDM / ASSESSMENT AND PLAN / ED COURSE  I reviewed the triage vital signs and the nursing notes.    Patient here with cough, congestion without fever.   DIFFERENTIAL DIAGNOSIS  (includes but not limited to):   Viral URI, pneumonia, seasonal allergies, asthma exacerbation, doubt CHF, PE, ACS   Patient's presentation is most consistent with acute complicated illness / injury requiring diagnostic workup.   PLAN: Chest x-ray, COVID/flu/RSV swabs pending.  Will give Tussidex for symptomatic relief.  Lungs currently clear to auscultation.  No hypoxia or respiratory distress.   MEDICATIONS GIVEN IN ED: Medications  chlorpheniramine-HYDROcodone (TUSSIONEX) 10-8 MG/5ML suspension 5 mL (5 mLs Oral Given 03/01/24 0324)  ondansetron  (ZOFRAN -ODT) disintegrating tablet 4 mg (4 mg Oral Given 03/01/24 0324)     ED COURSE: Chest x-ray reviewed and interpreted by myself and radiologist and shows no acute abnormality.  COVID, flu and RSV negative.  Will discharge with prescription for Tussionex.  Advised her to continue her antihistamines and Flonase.  No indication for antibiotics.  Patient comfortable with this plan.  She has a PCP for follow-up if symptoms not improving.   At this time, I do not feel there is any life-threatening condition present. I reviewed all nursing notes, vitals, pertinent previous records.  All lab and urine results, EKGs, imaging ordered have been independently reviewed and interpreted by myself.  I reviewed all available radiology reports from any imaging ordered this visit.  Based on my assessment, I feel the patient is safe to be discharged home without further emergent workup and can continue workup as an outpatient as needed. Discussed all findings, treatment plan as well as usual and customary return precautions.  They verbalize understanding and are comfortable with this plan.  Outpatient follow-up has been provided as needed.  All questions have been answered.    CONSULTS:  none   OUTSIDE RECORDS REVIEWED: Reviewed last OB/GYN note in March 2025.       FINAL CLINICAL IMPRESSION(S) / ED DIAGNOSES   Final diagnoses:  Viral URI with cough      Rx / DC Orders   ED Discharge Orders          Ordered    chlorpheniramine-HYDROcodone (TUSSIONEX) 10-8 MG/5ML  Every 12 hours PRN        03/01/24 0338    ondansetron  (ZOFRAN -ODT) 4 MG disintegrating tablet  Every 6 hours PRN        03/01/24 1610             Note:  This document was prepared using Dragon voice recognition software and may include unintentional dictation errors.   Jaylin Benzel, Clover Dao, DO 03/01/24 [phone]
